# Patient Record
Sex: Female | Born: 1995 | Race: White | Hispanic: No | Marital: Single | Smoking: Current every day smoker
Health system: Southern US, Community
[De-identification: ages and names within clinical notes are randomized; demographics above are authoritative.]

---
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
## 2020-12-11 ENCOUNTER — Other Ambulatory Visit: Payer: Self-pay

## 2020-12-11 ENCOUNTER — Encounter (HOSPITAL_COMMUNITY): Payer: Self-pay

## 2020-12-11 ENCOUNTER — Ambulatory Visit (HOSPITAL_COMMUNITY)
Admission: RE | Admit: 2020-12-11 | Discharge: 2020-12-11 | Disposition: A | Discharge status: Home / Self Care | Payer: Self-pay | Source: Ambulatory Visit | Attending: Urgent Care | Admitting: Urgent Care

## 2020-12-11 ENCOUNTER — Ambulatory Visit (INDEPENDENT_AMBULATORY_CARE_PROVIDER_SITE_OTHER): Payer: Self-pay

## 2020-12-11 VITALS — BP 138/87 | HR 97 | Temp 98.6°F | Resp 19

## 2020-12-11 DIAGNOSIS — R200 Anesthesia of skin: Secondary | ICD-10-CM

## 2020-12-11 DIAGNOSIS — Z833 Family history of diabetes mellitus: Secondary | ICD-10-CM

## 2020-12-11 DIAGNOSIS — R202 Paresthesia of skin: Secondary | ICD-10-CM

## 2020-12-11 DIAGNOSIS — M79601 Pain in right arm: Secondary | ICD-10-CM

## 2020-12-11 LAB — CBG MONITORING, ED: Glucose-Capillary: 89 mg/dL (ref 70–99)

## 2020-12-11 MED ORDER — NAPROXEN 500 MG PO TABS: 500.0000 mg | ORAL_TABLET | Freq: Two times a day (BID) | ORAL | 0 refills | Status: DC

## 2020-12-11 NOTE — ED Triage Notes (Signed)
Pt in with c/o bilateral hand numbness and tingling that has been going on for about 1 week now  Pt states that pain is worse at night and today she feels it constantly in her left hand and forearm

## 2020-12-11 NOTE — ED Provider Notes (Signed)
[facility]   MRN: [id number] DOB: 1996-10-02  Subjective:   Kimberly Krause is a 25 y.o. female presenting for 1 year history of gradually worsening bilateral hand tingling and numbness.  Symptoms have worsened significantly in the past week.  Now has symptoms over her forearms as well with pain, worse at night.  She has been dropping items at work due to her symptoms.  Denies any neck pain, rashes, swelling.  Family history is positive for diabetes with her father.  Outside of her current symptoms, she reports that she is healthy.  Denies taking chronic medications.  No current facility-administered medications for this encounter. No current outpatient medications on file.   Allergies  Allergen Reactions  . Sulfa Antibiotics Rash    History reviewed. No pertinent past medical history.   History reviewed. No pertinent surgical history.  Family History  Problem Relation Age of Onset  . Hypertension Mother   . Diabetes Father   . Hypertension Father     Social History   Tobacco Use  . Smoking status: Current Every Day Smoker  . Smokeless tobacco: Never Used  Substance Use Topics  . Drug use: Never    ROS   Objective:   Vitals: BP 138/87   Pulse 97   Temp 98.6 F (37 C)   Resp 19   LMP 11/02/2020 (Approximate)   SpO2 97%   Physical Exam Constitutional:      General: She is not in acute distress.    Appearance: Normal appearance. She is well-developed. She is not ill-appearing, toxic-appearing or diaphoretic.  HENT:     Head: Normocephalic and atraumatic.     Nose: Nose normal.     Mouth/Throat:     Mouth: Mucous membranes are moist.     Pharynx: Oropharynx is clear.  Eyes:     General: No scleral icterus.       Right eye: No discharge.        Left eye: No discharge.     Extraocular Movements: Extraocular movements intact.     Conjunctiva/sclera: Conjunctivae normal.     Pupils: Pupils are equal, round, and reactive to light.   Cardiovascular:     Rate and Rhythm: Normal rate.  Pulmonary:     Effort: Pulmonary effort is normal.  Musculoskeletal:     Right elbow: No swelling, deformity, effusion or lacerations. Normal range of motion. No tenderness.     Left elbow: No swelling, deformity, effusion or lacerations. Normal range of motion. No tenderness.     Right forearm: Tenderness present. No swelling, edema, deformity, lacerations or bony tenderness.     Left forearm: Tenderness present. No swelling, edema, deformity, lacerations or bony tenderness.     Right wrist: Tenderness present. No swelling, deformity, effusion, lacerations, bony tenderness, snuff box tenderness or crepitus. Normal range of motion.     Left wrist: Tenderness present. No swelling, deformity, effusion, lacerations, bony tenderness, snuff box tenderness or crepitus. Normal range of motion.     Right hand: No swelling, deformity, lacerations, tenderness or bony tenderness. Normal range of motion. Normal strength. Normal sensation. Normal capillary refill.     Left hand: No swelling, deformity, lacerations, tenderness or bony tenderness. Normal range of motion. Normal strength. Normal sensation. Normal capillary refill.     Cervical back: No swelling, edema, deformity, erythema, signs of trauma, lacerations, rigidity, spasms, torticollis, tenderness, bony tenderness or crepitus. No pain with movement. Normal range of motion.     Comments: Strength  5/5 for upper extremities bilaterally.  Endorses tenderness along the entirety of her hands, also of the medial/ulnar aspect of her forearms including the ventral surface.  Positive Tinel's of both forearms bilaterally.  Skin:    General: Skin is warm and dry.  Neurological:     General: No focal deficit present.     Mental Status: She is alert and oriented to person, place, and time.     Motor: No weakness.     Coordination: Coordination normal.     Gait: Gait normal.     Deep Tendon Reflexes: Reflexes  normal.  Psychiatric:        Mood and Affect: Mood normal.        Behavior: Behavior normal.        Thought Content: Thought content normal.        Judgment: Judgment normal.     Results for orders placed or performed during the hospital encounter of 12/11/20 (from the past 24 hour(s))  POC CBG monitoring     Status: None   Collection Time: 12/11/20  5:36 PM  Result Value Ref Range   Glucose-Capillary 89 70 - 99 mg/dL   DG Cervical Spine Complete  Result Date: 12/11/2020 CLINICAL DATA:  One week of bilateral hand numbness and tingling EXAM: CERVICAL SPINE - COMPLETE 4+ VIEW COMPARISON:  None. FINDINGS: 7 cervical type vertebral bodies are visualized on the lateral view there is no evidence of cervical spine fracture or prevertebral soft tissue swelling. Alignment is normal. No other significant bone abnormalities are identified. IMPRESSION: Negative cervical spine radiographs. Electronically Signed   By: Maudry Mayhew MD   On: 12/11/2020 17:48   Assessment and Plan :   PDMP not reviewed this encounter.  1. Numbness and tingling in both hands   2. Bilateral arm pain   3. Family history of diabetes mellitus   4. Bilateral arm numbness and tingling while sleeping     Negative diabetes, cervical x-rays. Unclear etiology for her neuropathic type pain.  Suspect that this is related to the strenuous and repetitive nature of her work.  Recommended rest, naproxen for pain and inflammation.  Follow-up with Ortho soon as possible for further work-up. Counseled patient on potential for adverse effects with medications prescribed/recommended today, ER and return-to-clinic precautions discussed, patient verbalized understanding.    Wallis Bamberg, [place] 12/11/20 [id number]

## 2020-12-16 ENCOUNTER — Other Ambulatory Visit: Payer: Self-pay

## 2020-12-16 ENCOUNTER — Ambulatory Visit (HOSPITAL_COMMUNITY)
Admission: EM | Admit: 2020-12-16 | Discharge: 2020-12-16 | Disposition: A | Discharge status: Home / Self Care | Payer: Self-pay | Attending: Urgent Care | Admitting: Urgent Care

## 2020-12-16 ENCOUNTER — Encounter (HOSPITAL_COMMUNITY): Payer: Self-pay

## 2020-12-16 DIAGNOSIS — R059 Cough, unspecified: Secondary | ICD-10-CM

## 2020-12-16 DIAGNOSIS — R0981 Nasal congestion: Secondary | ICD-10-CM

## 2020-12-16 DIAGNOSIS — R000 Tachycardia, unspecified: Secondary | ICD-10-CM

## 2020-12-16 DIAGNOSIS — J029 Acute pharyngitis, unspecified: Secondary | ICD-10-CM

## 2020-12-16 DIAGNOSIS — J020 Streptococcal pharyngitis: Secondary | ICD-10-CM

## 2020-12-16 LAB — POCT RAPID STREP A, ED / UC: Streptococcus, Group A Screen (Direct): POSITIVE — AB

## 2020-12-16 MED ORDER — AMOXICILLIN 500 MG PO CAPS: 500.0000 mg | ORAL_CAPSULE | Freq: Two times a day (BID) | ORAL | 0 refills | Status: DC

## 2020-12-16 NOTE — ED Triage Notes (Signed)
Pt presents with sore throat X 2 days. 

## 2020-12-16 NOTE — ED Provider Notes (Addendum)
[facility]   MRN: [id number] DOB: 1996-06-25  Subjective:   Kimberly Krause is a 25 y.o. female presenting for 2-day history of acute onset nasal congestion, throat pain, painful swallowing, decreased appetite and a mild cough.  Patient did have COVID-19 at the end of 2021.  She is a smoker.  Denies chest pain, shortness of breath, fever, body aches.  No current facility-administered medications for this encounter.  Current Outpatient Medications:  .  naproxen (NAPROSYN) 500 MG tablet, Take 1 tablet (500 mg total) by mouth 2 (two) times daily with a meal., Disp: 30 tablet, Rfl: 0   Allergies  Allergen Reactions  . Sulfa Antibiotics Rash    History reviewed. No pertinent past medical history.   History reviewed. No pertinent surgical history.  Family History  Problem Relation Age of Onset  . Hypertension Mother   . Diabetes Father   . Hypertension Father     Social History   Tobacco Use  . Smoking status: Current Every Day Smoker  . Smokeless tobacco: Never Used  Substance Use Topics  . Drug use: Never    ROS   Objective:   Vitals: BP (!) 141/90 (BP Location: Left Arm)   Pulse (!) 108   Temp 98.8 F (37.1 C) (Oral)   Resp 18   LMP 12/16/2020   SpO2 96%   Physical Exam Constitutional:      General: She is not in acute distress.    Appearance: Normal appearance. She is well-developed. She is not ill-appearing, toxic-appearing or diaphoretic.  HENT:     Head: Normocephalic and atraumatic.     Nose: Nose normal.     Mouth/Throat:     Mouth: Mucous membranes are moist.     Pharynx: Posterior oropharyngeal erythema present. No pharyngeal swelling, oropharyngeal exudate or uvula swelling.     Tonsils: No tonsillar exudate or tonsillar abscesses. 1+ on the right. 1+ on the left.  Eyes:     Extraocular Movements: Extraocular movements intact.     Pupils: Pupils are equal, round, and reactive to light.  Cardiovascular:     Rate and Rhythm:  Normal rate and regular rhythm.     Pulses: Normal pulses.     Heart sounds: Normal heart sounds. No murmur heard. No friction rub. No gallop.   Pulmonary:     Effort: Pulmonary effort is normal. No respiratory distress.     Breath sounds: Normal breath sounds. No stridor. No wheezing, rhonchi or rales.  Skin:    General: Skin is warm and dry.     Findings: No rash.  Neurological:     Mental Status: She is alert and oriented to person, place, and time.  Psychiatric:        Mood and Affect: Mood normal.        Behavior: Behavior normal.        Thought Content: Thought content normal.     Results for orders placed or performed during the hospital encounter of 12/16/20 (from the past 24 hour(s))  POCT Rapid Strep A     Status: Abnormal   Collection Time: 12/16/20  9:11 AM  Result Value Ref Range   Streptococcus, Group A Screen (Direct) POSITIVE (A) NEGATIVE    Assessment and Plan :   PDMP not reviewed this encounter.  1. Strep throat   2. Sore throat   3. Nasal congestion   4. Cough   5. Tachycardia     Will treat for strep pharyngitis.  Patient is to start amoxicillin, use supportive care otherwise. Suspect tachycardia is sign of systemic illness. Will monitor. Counseled patient on potential for adverse effects with medications prescribed/recommended today, ER and return-to-clinic precautions discussed, patient verbalized understanding.   Wallis Bamberg, [place] 12/16/20 [phone]

## 2020-12-17 ENCOUNTER — Ambulatory Visit (INDEPENDENT_AMBULATORY_CARE_PROVIDER_SITE_OTHER): Payer: Self-pay | Admitting: Orthopaedic Surgery

## 2020-12-17 ENCOUNTER — Encounter: Payer: Self-pay | Admitting: Orthopaedic Surgery

## 2020-12-17 DIAGNOSIS — R200 Anesthesia of skin: Secondary | ICD-10-CM

## 2020-12-17 NOTE — Addendum Note (Signed)
Addended by: Fredda Hammed L on: 12/17/2020 11:13 AM   Modules accepted: Orders

## 2020-12-17 NOTE — Progress Notes (Signed)
Office Visit Note   Patient: Kimberly Krause           Date of Birth: [birth year]           MRN: [id number] Visit Date: 12/17/2020              Requested by: Wallis Bamberg, PA-C [address] [place],  [place] [address] PCP: Patient, No Pcp Per   Assessment & Plan: Visit Diagnoses:  1. Bilateral hand numbness     Plan: We will obtain EMG nerve conduction studies of both upper extremities rule out carpal tunnel syndrome as source of her numbness tingling.  Her distribution of numbness is both in the ulnar and median nerve distribution of both hands and I discussed with her that the studies could help differentiate what is going on with both hands.  We will see her back about a week after the studies to go over the results and discuss further treatment.  Questions were encouraged and answered.  Follow-Up Instructions: Return Follow up after EMG NCS.   Orders:  No orders of the defined types were placed in this encounter.  No orders of the defined types were placed in this encounter.     Procedures: No procedures performed   Clinical Data: No additional findings.   Subjective: Chief Complaint  Patient presents with  . Left Hand - Pain  . Right Hand - Pain    HPI Patient is a 25 year old female seen for the first time for numbness tingling in both hands been ongoing for the last year.  She states her left hand is worse than the right.  Numbness tingling does awaken her.  She has to shake her hands.  She has numbness tingling in both hands during the day.  She states that she has difficulty opening objects due to the numbness tingling in her hands and drops things occasionally.  She is tried naproxen without any real relief.  She is seen in the ER yesterday and tested positive for strep throat.  Review of Systems Please see HPI otherwise negative  Objective: Vital Signs: LMP 12/16/2020   Physical Exam Constitutional:      Appearance: She is not ill-appearing or  diaphoretic.  Pulmonary:     Effort: Pulmonary effort is normal.  Neurological:     Mental Status: She is alert and oriented to person, place, and time.  Psychiatric:        Mood and Affect: Mood normal.     Ortho Exam Bilateral hands she has full motor.  Subjective decreased sensation left hand second through fifth fingers to light touch.  Full sensation right hand throughout the light touch.  Phalen's test is positive on the left negative on the right.  Compression test over the median nerve at the left wrist causes numbness tingling throughout the whole hand.  Tinel's over the wrist bilaterally is negative.  Negative compression test over the right median nerve at the wrist.  Full range of motion cervical spine without pain. Specialty Comments:  No specialty comments available.  Imaging: No results found.   PMFS History: There are no problems to display for this patient.  No past medical history on file.  Family History  Problem Relation Age of Onset  . Hypertension Mother   . Diabetes Father   . Hypertension Father     No past surgical history on file. Social History   Occupational History  . Not on file  Tobacco Use  . Smoking status:  Current Every Day Smoker  . Smokeless tobacco: Never Used  Substance and Sexual Activity  . Alcohol use: Not on file  . Drug use: Never  . Sexual activity: Not on file

## 2020-12-19 ENCOUNTER — Telehealth: Payer: Self-pay | Admitting: Physical Medicine and Rehabilitation

## 2020-12-19 NOTE — Telephone Encounter (Signed)
Patient called returning a call to [place]. Please call patient at [phone].

## 2021-01-22 ENCOUNTER — Telehealth: Payer: Self-pay | Admitting: Physical Medicine and Rehabilitation

## 2021-01-22 ENCOUNTER — Encounter: Payer: Medicaid Other | Admitting: Physical Medicine and Rehabilitation

## 2021-01-22 NOTE — Telephone Encounter (Signed)
Rescheduled

## 2021-01-22 NOTE — Telephone Encounter (Signed)
Patient called requesting a call back to reschedule an appt. Please call patient at [phone].

## 2021-01-31 DIAGNOSIS — Z419 Encounter for procedure for purposes other than remedying health state, unspecified: Secondary | ICD-10-CM | POA: Diagnosis not present

## 2021-02-25 ENCOUNTER — Encounter: Payer: Self-pay | Admitting: Physical Medicine and Rehabilitation

## 2021-02-25 ENCOUNTER — Other Ambulatory Visit: Payer: Self-pay

## 2021-02-25 ENCOUNTER — Ambulatory Visit (INDEPENDENT_AMBULATORY_CARE_PROVIDER_SITE_OTHER): Payer: Medicaid Other | Admitting: Physical Medicine and Rehabilitation

## 2021-02-25 DIAGNOSIS — R202 Paresthesia of skin: Secondary | ICD-10-CM

## 2021-02-25 NOTE — Progress Notes (Signed)
Pt state she has weakness, numbness and tingling in both hands. Pt state that it the pain wakes her up out of her sleep. 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 Pt state she right handed.  Numeric Pain Rating Scale and Functional Assessment Average Pain 5   In the last MONTH (on 0-10 scale) has pain interfered with the following?  1. General activity like being  able to carry out your everyday physical activities such as walking, climbing stairs, carrying groceries, or moving a chair?  Rating(8)

## 2021-02-26 NOTE — Progress Notes (Signed)
[facility]use - 25 y.o. female MRN [id number]  Date of birth: [birth year]  Office Visit Note: Visit Date: 02/25/2021 PCP: Patient, No Pcp Per (Inactive) Referred by: Kirtland Bouchard, PA-C  Subjective: Chief Complaint  Patient presents with  . Right Hand - Pain, Weakness, Numbness, Tingling  . Left Hand - Pain, Weakness, Numbness, Tingling   HPI:  Kimberly Krause is a 25 y.o. female who comes in today at the request of Rexene Edison, PA-C for electrodiagnostic study of the Bilateral upper extremities.  Patient is Right hand dominant.  She reports 5 out of 10 average pain with numbness and tingling into both hands somewhat more radial digits but can be global.  She reports that the pain wakes her up at night and she does get nocturnal symptoms.  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  She does feel like both hands are pretty equal.  She has no prior electrodiagnostic studies.  She is not diabetic.  There is no frank radicular symptoms.   ROS Otherwise per HPI.  Assessment & Plan: Visit Diagnoses:    ICD-10-CM   1. 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   There is no significant electrodiagnostic evidence of any other focal nerve entrapment, brachial plexopathy or cervical radiculopathy in either upper limb.   Recommendations: 1.  Follow-up with referring physician. 2.  Continue current management of symptoms. 3.  Continue use of resting splint at night-time and as needed during the day. 4.  Suggest surgical evaluation.  Meds & Orders: No orders of the defined types were placed in this encounter.   Orders Placed This Encounter  Procedures  . NCV with EMG (electromyography)    Follow-up: Return in about 2 weeks (around 03/11/2021)  for Rexene Edison, P.A.-C.   Procedures: No procedures performed  EMG & NCV Findings: Evaluation of the left median motor nerve showed reduced amplitude (1.4 mV) and decreased conduction velocity (Elbow-Wrist, 48 m/s).  The left median (across palm) sensory nerve showed prolonged distal peak latency (Wrist, 3.7 ms).  All remaining nerves (as indicated in the following tables) were within normal limits.  Left vs. Right side comparison data for the median motor nerve indicates abnormal L-R latency difference (0.8 ms), abnormal L-R amplitude difference (80.8 %), and abnormal L-R velocity difference (Elbow-Wrist, 10 m/s).    All examined muscles (as indicated in the following table) showed no evidence of electrical instability.    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   There is no significant electrodiagnostic evidence of any other focal nerve entrapment, brachial plexopathy or cervical radiculopathy in either upper limb.   Recommendations: 1.  Follow-up with referring physician. 2.  Continue current management of symptoms. 3.  Continue use of resting splint at night-time and as needed during the day. 4.  Suggest surgical evaluation.  ___________________________ Naaman Plummer FAAPMR Board Certified, American Board of Physical Medicine and Rehabilitation    Nerve Conduction Studies Anti Sensory Summary Table   Stim Site NR Peak (ms) Norm Peak (ms) P-T Amp (V) Norm P-T Amp Site1 Site2 Delta-P (ms) Dist (cm) Vel (m/s) Norm Vel (m/s)  Left Median Acr Maurilio Lovely  Anti Sensory (2nd Digit)  31.2C  Wrist    *3.7 <3.6 10.0 >10 Wrist Palm 1.8 0.0    Palm    1.9 <2.0 12.2         Right Median Acr Palm Anti Sensory (2nd Digit)  31.7C  Wrist    3.3 <3.6 29.0 >10 Wrist Palm 1.5 0.0    Palm    1.8 <2.0 23.0         Right Radial Anti Sensory (Base  1st Digit)  31.6C  Wrist    2.1 <3.1 22.9  Wrist Base 1st Digit 2.1 0.0    Right Ulnar Anti Sensory (5th Digit)  32C  Wrist    2.8 <3.7 32.7 >15.0 Wrist 5th Digit 2.8 14.0 50 >38   Motor Summary Table   Stim Site NR Onset (ms) Norm Onset (ms) O-P Amp (mV) Norm O-P Amp Site1 Site2 Delta-0 (ms) Dist (cm) Vel (m/s) Norm Vel (m/s)  Left Median Motor (Abd Poll Brev)  31.5C  Wrist    4.2 <4.2 *1.4 >5 Elbow Wrist 4.0 19.0 *48 >50  Elbow    8.2  5.9  Axilla Elbow 3.9 0.0    Axilla    4.3  1.6         Right Median Motor (Abd Poll Brev)  31.7C  Wrist    3.4 <4.2 7.3 >5 Elbow Wrist 3.6 21.0 58 >50  Elbow    7.0  8.6         Right Ulnar Motor (Abd Dig Min)  31.7C  Wrist    2.6 <4.2 11.9 >3 B Elbow Wrist 2.9 19.5 67 >53  B Elbow    5.5  7.8  A Elbow B Elbow 1.0 10.0 100 >53  A Elbow    6.5  7.7          EMG   Side Muscle Nerve Root Ins Act Fibs Psw Amp Dur Poly Recrt Int Dennie Bible Comment  Left Abd Poll Brev Median C8-T1 Nml Nml Nml Nml Nml 0 Nml Nml   Left 1stDorInt Ulnar C8-T1 Nml Nml Nml Nml Nml 0 Nml Nml   Left PronatorTeres Median C6-7 Nml Nml Nml Nml Nml 0 Nml Nml   Left Biceps Musculocut C5-6 Nml Nml Nml Nml Nml 0 Nml Nml   Left Deltoid Axillary C5-6 Nml Nml Nml Nml Nml 0 Nml Nml     Nerve Conduction Studies Anti Sensory Left/Right Comparison   Stim Site L Lat (ms) R Lat (ms) L-R Lat (ms) L Amp (V) R Amp (V) L-R Amp (%) Site1 Site2 L Vel (m/s) R Vel (m/s) L-R Vel (m/s)  Median Acr Palm Anti Sensory (2nd Digit)  31.2C  Wrist *3.7 3.3 0.4 10.0 29.0 65.5 Wrist Palm     Palm 1.9 1.8 0.1 12.2 23.0 47.0       Radial Anti Sensory (Base 1st Digit)  31.6C  Wrist  2.1   22.9  Wrist Base 1st Digit     Ulnar Anti Sensory (5th Digit)  32C  Wrist  2.8   32.7  Wrist 5th Digit  50    Motor Left/Right Comparison   Stim Site L Lat (ms) R Lat (ms) L-R Lat (ms) L Amp (mV) R Amp (mV) L-R Amp (%) Site1 Site2 L Vel (m/s) R Vel (m/s) L-R Vel (m/s)  Median Motor (Abd Poll Brev)  31.5C  Wrist 4.2  3.4 *0.8 *1.4 7.3 *80.8 Elbow Wrist *48 58 *10  Elbow 8.2 7.0 1.2 5.9 8.6 31.4 Axilla  Elbow     Axilla 4.3   1.6         Ulnar Motor (Abd Dig Min)  31.7C  Wrist  2.6   11.9  B Elbow Wrist  67   B Elbow  5.5   7.8  A Elbow B Elbow  100   A Elbow  6.5   7.7           Waveforms:                 Clinical History: No specialty comments available.     Objective:  VS:  HT:    WT:   BMI:     BP:   HR: bpm  TEMP: ( )  RESP:  Physical Exam Musculoskeletal:        General: No swelling, tenderness or deformity.     Comments: Inspection reveals no atrophy of the bilateral APB or FDI or hand intrinsics. There is no swelling, color changes, allodynia or dystrophic changes. There is 5 out of 5 strength in the bilateral wrist extension, finger abduction and long finger flexion. There is intact sensation to light touch in all dermatomal and peripheral nerve distributions.  There is a positive Phalen's test bilaterally. There is a negative Hoffmann's test bilaterally.  Skin:    General: Skin is warm and dry.     Findings: No erythema or rash.  Neurological:     General: No focal deficit present.     Mental Status: She is alert and oriented to person, place, and time.     Motor: No weakness or abnormal muscle tone.     Coordination: Coordination normal.  Psychiatric:        Mood and Affect: Mood normal.        Behavior: Behavior normal.      Imaging: No results found.

## 2021-02-26 NOTE — Procedures (Signed)
EMG & NCV Findings: Evaluation of the left median motor nerve showed reduced amplitude (1.4 mV) and decreased conduction velocity (Elbow-Wrist, 48 m/s).  The left median (across palm) sensory nerve showed prolonged distal peak latency (Wrist, 3.7 ms).  All remaining nerves (as indicated in the following tables) were within normal limits.  Left vs. Right side comparison data for the median motor nerve indicates abnormal L-R latency difference (0.8 ms), abnormal L-R amplitude difference (80.8 %), and abnormal L-R velocity difference (Elbow-Wrist, 10 m/s).    All examined muscles (as indicated in the following table) showed no evidence of electrical instability.    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   There is no significant electrodiagnostic evidence of any other focal nerve entrapment, brachial plexopathy or cervical radiculopathy in either upper limb.   Recommendations: 1.  Follow-up with referring physician. 2.  Continue current management of symptoms. 3.  Continue use of resting splint at night-time and as needed during the day. 4.  Suggest surgical evaluation.  ___________________________ Naaman Plummer FAAPMR Board Certified, American Board of Physical Medicine and Rehabilitation    Nerve Conduction Studies Anti Sensory Summary Table   Stim Site NR Peak (ms) Norm Peak (ms) P-T Amp (V) Norm P-T Amp Site1 Site2 Delta-P (ms) Dist (cm) Vel (m/s) Norm Vel (m/s)  Left Median Acr Palm Anti Sensory (2nd Digit)  31.2C  Wrist    *3.7 <3.6 10.0 >10 Wrist Palm 1.8 0.0    Palm    1.9 <2.0 12.2         Right Median Acr Palm Anti Sensory (2nd Digit)  31.7C  Wrist    3.3 <3.6 29.0 >10 Wrist Palm 1.5 0.0    Palm    1.8 <2.0 23.0         Right Radial Anti Sensory (Base 1st Digit)  31.6C  Wrist    2.1 <3.1 22.9  Wrist Base 1st Digit  2.1 0.0    Right Ulnar Anti Sensory (5th Digit)  32C  Wrist    2.8 <3.7 32.7 >15.0 Wrist 5th Digit 2.8 14.0 50 >38   Motor Summary Table   Stim Site NR Onset (ms) Norm Onset (ms) O-P Amp (mV) Norm O-P Amp Site1 Site2 Delta-0 (ms) Dist (cm) Vel (m/s) Norm Vel (m/s)  Left Median Motor (Abd Poll Brev)  31.5C  Wrist    4.2 <4.2 *1.4 >5 Elbow Wrist 4.0 19.0 *48 >50  Elbow    8.2  5.9  Axilla Elbow 3.9 0.0    Axilla    4.3  1.6         Right Median Motor (Abd Poll Brev)  31.7C  Wrist    3.4 <4.2 7.3 >5 Elbow Wrist 3.6 21.0 58 >50  Elbow    7.0  8.6         Right Ulnar Motor (Abd Dig Min)  31.7C  Wrist    2.6 <4.2 11.9 >3 B Elbow Wrist 2.9 19.5 67 >53  B Elbow    5.5  7.8  A Elbow B Elbow 1.0 10.0 100 >53  A Elbow    6.5  7.7          EMG   Side Muscle Nerve Root Ins Act Fibs Psw Amp Dur Poly Recrt Int Dennie Bible Comment  Left Abd Poll Brev Median C8-T1 Nml Nml Nml Nml Nml 0 Nml Nml  Left 1stDorInt Ulnar C8-T1 Nml Nml Nml Nml Nml 0 Nml Nml   Left PronatorTeres Median C6-7 Nml Nml Nml Nml Nml 0 Nml Nml   Left Biceps Musculocut C5-6 Nml Nml Nml Nml Nml 0 Nml Nml   Left Deltoid Axillary C5-6 Nml Nml Nml Nml Nml 0 Nml Nml     Nerve Conduction Studies Anti Sensory Left/Right Comparison   Stim Site L Lat (ms) R Lat (ms) L-R Lat (ms) L Amp (V) R Amp (V) L-R Amp (%) Site1 Site2 L Vel (m/s) R Vel (m/s) L-R Vel (m/s)  Median Acr Palm Anti Sensory (2nd Digit)  31.2C  Wrist *3.7 3.3 0.4 10.0 29.0 65.5 Wrist Palm     Palm 1.9 1.8 0.1 12.2 23.0 47.0       Radial Anti Sensory (Base 1st Digit)  31.6C  Wrist  2.1   22.9  Wrist Base 1st Digit     Ulnar Anti Sensory (5th Digit)  32C  Wrist  2.8   32.7  Wrist 5th Digit  50    Motor Left/Right Comparison   Stim Site L Lat (ms) R Lat (ms) L-R Lat (ms) L Amp (mV) R Amp (mV) L-R Amp (%) Site1 Site2 L Vel (m/s) R Vel (m/s) L-R Vel (m/s)  Median Motor (Abd Poll Brev)  31.5C  Wrist 4.2 3.4 *0.8 *1.4 7.3 *80.8 Elbow Wrist *48 58 *10  Elbow 8.2 7.0 1.2  5.9 8.6 31.4 Axilla Elbow     Axilla 4.3   1.6         Ulnar Motor (Abd Dig Min)  31.7C  Wrist  2.6   11.9  B Elbow Wrist  67   B Elbow  5.5   7.8  A Elbow B Elbow  100   A Elbow  6.5   7.7           Waveforms:

## 2021-03-02 DIAGNOSIS — Z419 Encounter for procedure for purposes other than remedying health state, unspecified: Secondary | ICD-10-CM | POA: Diagnosis not present

## 2021-03-03 ENCOUNTER — Ambulatory Visit: Payer: Medicaid Other | Admitting: Physician Assistant

## 2021-04-02 DIAGNOSIS — Z419 Encounter for procedure for purposes other than remedying health state, unspecified: Secondary | ICD-10-CM | POA: Diagnosis not present

## 2021-04-18 DIAGNOSIS — F17210 Nicotine dependence, cigarettes, uncomplicated: Secondary | ICD-10-CM | POA: Diagnosis not present

## 2021-04-18 DIAGNOSIS — R070 Pain in throat: Secondary | ICD-10-CM | POA: Diagnosis not present

## 2021-04-18 DIAGNOSIS — J029 Acute pharyngitis, unspecified: Secondary | ICD-10-CM | POA: Diagnosis not present

## 2021-04-18 DIAGNOSIS — Z20822 Contact with and (suspected) exposure to covid-19: Secondary | ICD-10-CM | POA: Diagnosis not present

## 2021-04-18 DIAGNOSIS — R059 Cough, unspecified: Secondary | ICD-10-CM | POA: Diagnosis not present

## 2021-04-22 DIAGNOSIS — R059 Cough, unspecified: Secondary | ICD-10-CM | POA: Diagnosis not present

## 2021-04-22 DIAGNOSIS — J029 Acute pharyngitis, unspecified: Secondary | ICD-10-CM | POA: Diagnosis not present

## 2021-04-25 ENCOUNTER — Other Ambulatory Visit: Payer: Self-pay

## 2021-04-25 ENCOUNTER — Emergency Department (HOSPITAL_BASED_OUTPATIENT_CLINIC_OR_DEPARTMENT_OTHER): Payer: Medicaid Other | Admitting: Radiology

## 2021-04-25 ENCOUNTER — Encounter (HOSPITAL_BASED_OUTPATIENT_CLINIC_OR_DEPARTMENT_OTHER): Payer: Self-pay

## 2021-04-25 ENCOUNTER — Emergency Department (HOSPITAL_BASED_OUTPATIENT_CLINIC_OR_DEPARTMENT_OTHER)
Admission: EM | Admit: 2021-04-25 | Discharge: 2021-04-26 | Disposition: A | Discharge status: Home / Self Care | Payer: Medicaid Other | Attending: Emergency Medicine | Admitting: Emergency Medicine

## 2021-04-25 DIAGNOSIS — F17210 Nicotine dependence, cigarettes, uncomplicated: Secondary | ICD-10-CM | POA: Insufficient documentation

## 2021-04-25 DIAGNOSIS — J209 Acute bronchitis, unspecified: Secondary | ICD-10-CM | POA: Diagnosis not present

## 2021-04-25 DIAGNOSIS — R519 Headache, unspecified: Secondary | ICD-10-CM | POA: Diagnosis not present

## 2021-04-25 DIAGNOSIS — R059 Cough, unspecified: Secondary | ICD-10-CM | POA: Diagnosis not present

## 2021-04-25 LAB — COMPREHENSIVE METABOLIC PANEL
ALT: 13 U/L (ref 0–44)
AST: 12 U/L — ABNORMAL LOW (ref 15–41)
Albumin: 3.8 g/dL (ref 3.5–5.0)
Alkaline Phosphatase: 64 U/L (ref 38–126)
Anion gap: 9 (ref 5–15)
BUN: 10 mg/dL (ref 6–20)
CO2: 26 mmol/L (ref 22–32)
Calcium: 8.8 mg/dL — ABNORMAL LOW (ref 8.9–10.3)
Chloride: 104 mmol/L (ref 98–111)
Creatinine, Ser: 0.78 mg/dL (ref 0.44–1.00)
GFR, Estimated: >60 mL/min (ref >60)
Glucose, Bld: 92 mg/dL (ref 70–99)
Potassium: 4.1 mmol/L (ref 3.5–5.1)
Sodium: 139 mmol/L (ref 135–145)
Total Bilirubin: 0.3 mg/dL (ref 0.3–1.2)
Total Protein: 6.5 g/dL (ref 6.5–8.1)

## 2021-04-25 LAB — CBC WITH DIFFERENTIAL/PLATELET
Abs Immature Granulocytes: 0.03 10*3/uL (ref 0.00–0.07)
Basophils Absolute: 0.1 10*3/uL (ref 0.0–0.1)
Basophils Relative: 1 %
Eosinophils Absolute: 0.1 10*3/uL (ref 0.0–0.5)
Eosinophils Relative: 1 %
HCT: 39.5 % (ref 36.0–46.0)
Hemoglobin: 13 g/dL (ref 12.0–15.0)
Immature Granulocytes: 0 %
Lymphocytes Relative: 28 %
Lymphs Abs: 3.2 10*3/uL (ref 0.7–4.0)
MCH: 27.9 pg (ref 26.0–34.0)
MCHC: 32.9 g/dL (ref 30.0–36.0)
MCV: 84.8 fL (ref 80.0–100.0)
Monocytes Absolute: 0.7 10*3/uL (ref 0.1–1.0)
Monocytes Relative: 6 %
Neutro Abs: 7.6 10*3/uL (ref 1.7–7.7)
Neutrophils Relative %: 64 %
Platelets: 277 10*3/uL (ref 150–400)
RBC: 4.66 MIL/uL (ref 3.87–5.11)
RDW: 14 % (ref 11.5–15.5)
WBC: 11.7 10*3/uL — ABNORMAL HIGH (ref 4.0–10.5)
nRBC: 0 % (ref 0.0–0.2)

## 2021-04-25 LAB — MONONUCLEOSIS SCREEN: Mono Screen: NEGATIVE

## 2021-04-25 MED ORDER — SODIUM CHLORIDE 0.9 % IV BOLUS
1000.0000 mL | Freq: Once | INTRAVENOUS | Status: AC
  Administered 2021-04-25: 1000 mL via INTRAVENOUS

## 2021-04-25 MED ORDER — PROCHLORPERAZINE EDISYLATE 10 MG/2ML IJ SOLN
10.0000 mg | Freq: Once | INTRAMUSCULAR | Status: AC
  Administered 2021-04-25: 10 mg via INTRAVENOUS
  Filled 2021-04-25: qty 2

## 2021-04-25 MED ORDER — IPRATROPIUM-ALBUTEROL 0.5-2.5 (3) MG/3ML IN SOLN
3.0000 mL | Freq: Once | RESPIRATORY_TRACT | Status: AC
  Administered 2021-04-26: 3 mL via RESPIRATORY_TRACT
  Filled 2021-04-25: qty 3

## 2021-04-25 MED ORDER — DIPHENHYDRAMINE HCL 50 MG/ML IJ SOLN
25.0000 mg | Freq: Once | INTRAMUSCULAR | Status: AC
  Administered 2021-04-25: 25 mg via INTRAVENOUS
  Filled 2021-04-25: qty 1

## 2021-04-25 NOTE — ED Triage Notes (Signed)
Patient here POV from Home with Cough, Sore Throat, Fatigue, and Headache.  Cough and Sore Throat began approximately 2 weeks PTA. Patient was prescribed Antibiotics from UC after COVID-19 Test and Strep Test were both negative.   Symptoms have worsened and progressed into Fatigue and Headache 2 days PTA.   Ambulatory, GCS 15. Mild Nausea.

## 2021-04-25 NOTE — ED Provider Notes (Signed)
[facility] EMERGENCY DEPT Provider Note   CSN: [id number] Arrival date & time: 04/25/21  2052     History Chief Complaint  Patient presents with   Sore Throat   Cough   Headache   Fatigue    Kimberly Krause is a 25 y.o. female.  The history is provided by the patient.  Sore Throat Associated symptoms include headaches.  Cough Associated symptoms: headaches   Headache Associated symptoms: cough   She has been sick for about the last 10 days with sore throat and cough.  Cough is productive of some yellowish sputum.  She denies fever, chills, sweats.  There has been some anorexia but no nausea or vomiting.  She has also had a global headache which has gotten worse over the last 2 days.  Headache is worse when she sits up, better if she is laying down.  She denies arthralgias or myalgias.  She had gone to an urgent care center where strep and COVID tests were negative and she was treated with azithromycin and prednisone with no relief.   History reviewed. No pertinent past medical history.  There are no problems to display for this patient.   History reviewed. No pertinent surgical history.   OB History   No obstetric history on file.     Family History  Problem Relation Age of Onset   Hypertension Mother    Diabetes Father    Hypertension Father     Social History   Tobacco Use   Smoking status: Every Day    Pack years: 0.00    Types: Cigarettes   Smokeless tobacco: Never  Substance Use Topics   Alcohol use: Yes    Comment: Socially   Drug use: Never    Home Medications Prior to Admission medications   Medication Sig Start Date End Date Taking? Authorizing Provider  amoxicillin (AMOXIL) 500 MG capsule Take 1 capsule (500 mg total) by mouth 2 (two) times daily. 12/16/20   Wallis Bamberg, PA-C  naproxen (NAPROSYN) 500 MG tablet Take 1 tablet (500 mg total) by mouth 2 (two) times daily with a meal. 12/11/20   Wallis Bamberg, PA-C    Allergies     Sulfa antibiotics  Review of Systems   Review of Systems  Respiratory:  Positive for cough.   Neurological:  Positive for headaches.  All other systems reviewed and are negative.  Physical Exam Updated Vital Signs BP 131/78 (BP Location: Right Arm)   Pulse 100   Temp 98.7 F (37.1 C) (Oral)   Resp 16   Ht 5\' 5"  (1.651 m)   Wt 127 kg   SpO2 100%   BMI 46.59 kg/m   Physical Exam Vitals and nursing note reviewed.  25 year old female, resting comfortably and in no acute distress. Vital signs are normal. Oxygen saturation is 100%, which is normal. Head is normocephalic and atraumatic. PERRLA, EOMI. Oropharynx is clear. Neck is nontender and supple without adenopathy or JVD. Back is nontender and there is no CVA tenderness. Lungs are clear without rales, wheezes, or rhonchi.  There is a slight prolongation of exhalation phase and slight wheezing is noted with cough. Chest is nontender. Heart has regular rate and rhythm without murmur. Abdomen is soft, flat, nontender without masses or hepatosplenomegaly and peristalsis is normoactive. Extremities have no cyanosis or edema, full range of motion is present. Skin is warm and dry without rash. Neurologic: Mental status is normal, cranial nerves are intact, there are no motor or sensory  deficits.  ED Results / Procedures / Treatments   Labs (all labs ordered are listed, but only abnormal results are displayed) Labs Reviewed  CBC WITH DIFFERENTIAL/PLATELET - Abnormal; Notable for the following components:      Result Value   WBC 11.7 (*)    All other components within normal limits  COMPREHENSIVE METABOLIC PANEL - Abnormal; Notable for the following components:   Calcium 8.8 (*)    AST 12 (*)    All other components within normal limits  MONONUCLEOSIS SCREEN   Radiology DG Chest 2 View  Result Date: 04/25/2021 CLINICAL DATA:  Cough EXAM: CHEST - 2 VIEW COMPARISON:  None. FINDINGS: The heart size and mediastinal contours are  within normal limits. Both lungs are clear. The visualized skeletal structures are unremarkable. IMPRESSION: No active cardiopulmonary disease. Electronically Signed   By: Jasmine Pang M.D.   On: 04/25/2021 23:39    Procedures Procedures   Medications Ordered in ED Medications  sodium chloride 0.9 % bolus 1,000 mL (has no administration in time range)  diphenhydrAMINE (BENADRYL) injection 25 mg (has no administration in time range)  prochlorperazine (COMPAZINE) injection 10 mg (has no administration in time range)  ipratropium-albuterol (DUONEB) 0.5-2.5 (3) MG/3ML nebulizer solution 3 mL (has no administration in time range)    ED Course  I have reviewed the triage vital signs and the nursing notes.  Pertinent labs & imaging results that were available during my care of the patient were reviewed by me and considered in my medical decision making (see chart for details).   MDM Rules/Calculators/A&P                         Sore throat, cough, headache which appears to be part of a viral syndrome.  Pattern is not suggestive of influenza, and she did have a negative COVID test.  Will check monoscreen.  We will also check chest x-ray to rule out pneumonia.  She will be given IV fluids, prochlorperazine, diphenhydramine to try to treat her headache, will also be given a therapeutic trial of albuterol with ipratropium via nebulizer.  Old records were reviewed, and she was treated for strep throat in February of this year.  Chest x-ray shows no evidence of pneumonia.  Labs are unremarkable including negative monoscreen.  Cough is much improved following albuterol with ipratropium.  Headache is completely resolved following headache cocktail.  Patient is discharged with prescription for albuterol inhaler and told to use over-the-counter analgesics as needed for headache.  Encouraged to maintain good fluid intake.  Return precautions discussed.  Final Clinical Impression(s) / ED Diagnoses Final  diagnoses:  Acute bronchitis, unspecified organism  Bad headache    Rx / DC Orders ED Discharge Orders          Ordered    albuterol (VENTOLIN HFA) 108 (90 Base) MCG/ACT inhaler  Every 4 hours PRN        04/26/21 0055             Dione Booze, MD 04/26/21 [phone]

## 2021-04-26 MED ORDER — ALBUTEROL SULFATE HFA 108 (90 BASE) MCG/ACT IN AERS: 2.0000 | INHALATION_SPRAY | RESPIRATORY_TRACT | 0 refills | Status: DC | PRN

## 2021-04-26 NOTE — Discharge Instructions (Addendum)
Drink plenty of fluids.  Take acetaminophen and/or ibuprofen as needed for headache.  Return if symptoms are getting worse - especially if you start running a fever.

## 2021-04-28 ENCOUNTER — Telehealth: Payer: Self-pay

## 2021-04-28 NOTE — Telephone Encounter (Signed)
Transition Care Management Follow-up Telephone Call Date of discharge and from where: 04/26/2021 from [facility] How have you been since you were released from the hospital? Pt states that she is feeling much better.  Any questions or concerns? No  Items Reviewed: Did the pt receive and understand the discharge instructions provided? Yes  Medications obtained and verified? Yes  Other? No  Any new allergies since your discharge? No  Dietary orders reviewed? No Do you have support at home? Yes   Functional Questionnaire: (I = Independent and D = Dependent) ADLs: I  Bathing/Dressing- I  Meal Prep- I  Eating- I  Maintaining continence- I  Transferring/Ambulation- I  Managing Meds- I   Follow up appointments reviewed:  PCP Hospital f/u appt confirmed? No   Specialist Hospital f/u appt confirmed? No   Are transportation arrangements needed? No  If their condition worsens, is the pt aware to call PCP or go to the Emergency Dept.? No Was the patient provided with contact information for the PCP's office or ED? No Was to pt encouraged to call back with questions or concerns? No

## 2021-05-02 DIAGNOSIS — Z419 Encounter for procedure for purposes other than remedying health state, unspecified: Secondary | ICD-10-CM | POA: Diagnosis not present

## 2021-06-02 DIAGNOSIS — Z419 Encounter for procedure for purposes other than remedying health state, unspecified: Secondary | ICD-10-CM | POA: Diagnosis not present

## 2021-06-16 ENCOUNTER — Encounter: Payer: Self-pay | Admitting: Physician Assistant

## 2021-06-16 ENCOUNTER — Other Ambulatory Visit: Payer: Self-pay

## 2021-06-16 ENCOUNTER — Ambulatory Visit (INDEPENDENT_AMBULATORY_CARE_PROVIDER_SITE_OTHER): Payer: Medicaid Other | Admitting: Physician Assistant

## 2021-06-16 DIAGNOSIS — R200 Anesthesia of skin: Secondary | ICD-10-CM | POA: Diagnosis not present

## 2021-06-16 DIAGNOSIS — G5602 Carpal tunnel syndrome, left upper limb: Secondary | ICD-10-CM | POA: Diagnosis not present

## 2021-06-16 MED ORDER — LIDOCAINE HCL 1 % IJ SOLN
0.5000 mL | INTRAMUSCULAR | Status: AC | PRN
  Administered 2021-06-16: .5 mL

## 2021-06-16 MED ORDER — METHYLPREDNISOLONE ACETATE 40 MG/ML IJ SUSP
20.0000 mg | INTRAMUSCULAR | Status: AC | PRN
  Administered 2021-06-16: 20 mg

## 2021-06-16 NOTE — Progress Notes (Signed)
   Procedure Note  Patient: Kimberly Krause             Date of Birth: October 20, 1996           MRN: [id number]             Visit Date: 06/16/2021 HPI Kimberly Krause returns today to go over the EMG nerve conduction studies of her upper extremities.  She was found to have moderate carpal tunnel syndrome on the left.  She states that the numbness tingling both hands getting worse.  She feels like her right hand is similar to what her left hand was back in February of this year.  She states the left hand is now waking her up several times at night.  Physical exam: Right wrist over the median nerve Tinel's is negative.  Compression test over the median nerve is positive on the right.    Procedures: Visit Diagnoses:  1. Carpal tunnel syndrome, left upper limb   2. Numbness of hand     Hand/UE Inj: R carpal tunnel for carpal tunnel syndrome on 06/16/2021 3:30 PM Medications: 0.5 mL lidocaine 1 %; 20 mg methylPREDNISolone acetate 40 MG/ML   Plan: We will see how she does with the injection on the right emesis with for therapeutic as well as diagnostic purposes.  Regards to the left carpal tunnel syndrome due to that she has moderate symptoms are becoming worse recommend carpal tunnel release.  Discussed risk benefits of surgery with the patient today.  Questions were encouraged and answered by myself and Dr. Magnus Ivan.  We will have her scheduled for a left carpal tunnel release near future.  She follow-up with [place] 2 weeks postop.  Questions encouraged and answered at length

## 2021-06-23 ENCOUNTER — Ambulatory Visit: Payer: Medicaid Other | Admitting: Physician Assistant

## 2021-07-03 DIAGNOSIS — Z419 Encounter for procedure for purposes other than remedying health state, unspecified: Secondary | ICD-10-CM | POA: Diagnosis not present

## 2021-07-17 ENCOUNTER — Other Ambulatory Visit: Payer: Self-pay | Admitting: Orthopaedic Surgery

## 2021-07-17 DIAGNOSIS — G5602 Carpal tunnel syndrome, left upper limb: Secondary | ICD-10-CM | POA: Diagnosis not present

## 2021-07-17 MED ORDER — HYDROCODONE-ACETAMINOPHEN 5-325 MG PO TABS: 1.0000 | ORAL_TABLET | Freq: Four times a day (QID) | ORAL | 0 refills | Status: DC | PRN

## 2021-07-25 ENCOUNTER — Telehealth: Payer: Self-pay | Admitting: Physician Assistant

## 2021-07-25 NOTE — Telephone Encounter (Signed)
Pt wants to know if she can clear the area where she had surgery

## 2021-07-25 NOTE — Telephone Encounter (Signed)
Patient aware she may get her hand wet she can't soak it

## 2021-07-31 ENCOUNTER — Ambulatory Visit (INDEPENDENT_AMBULATORY_CARE_PROVIDER_SITE_OTHER): Payer: Medicaid Other | Admitting: Physician Assistant

## 2021-07-31 ENCOUNTER — Encounter: Payer: Self-pay | Admitting: Physician Assistant

## 2021-07-31 DIAGNOSIS — Z9889 Other specified postprocedural states: Secondary | ICD-10-CM

## 2021-07-31 NOTE — Progress Notes (Signed)
HPI: Kimberly Krause returns today status post left carpal tunnel release 07/17/2021.  She states she is having no numbness tingling in the hand getting longer.  She does note some itching about the incision site.  Some soreness with some movements but overall doing well.  Physical exam: Left hand full motor full sensation.  Surgical incisions well approximated with interrupted nylon sutures.  There is no signs of infection.  Impression: Status post left carpal tunnel release 07/17/2021  Plan: Sutures removed Steri-Strips applied.  She will refrain from any heavy lifting greater than 2 pounds and no gripping with the left hand for the next 4 weeks.  We will see her back in 4 weeks see how she is doing overall.  She will follow-up sooner if there is any concerns and regarding the incision is partial dehiscence or infection.  She is able to wash the hand but refrain from submerging it.  Also having she will work on scar tissue mobilization.  Questions encouraged and answered.

## 2021-08-02 DIAGNOSIS — Z419 Encounter for procedure for purposes other than remedying health state, unspecified: Secondary | ICD-10-CM | POA: Diagnosis not present

## 2021-09-02 DIAGNOSIS — Z419 Encounter for procedure for purposes other than remedying health state, unspecified: Secondary | ICD-10-CM | POA: Diagnosis not present

## 2021-09-09 ENCOUNTER — Ambulatory Visit: Payer: Self-pay | Admitting: Nurse Practitioner

## 2021-09-25 IMAGING — DX DG CERVICAL SPINE COMPLETE 4+V
5 series · 5 of 5 positions shown · non-contrast
Comparison: None.

CLINICAL DATA: One week of bilateral hand numbness and tingling

EXAM:
CERVICAL SPINE - COMPLETE 4+ VIEW

[c-spine lat]
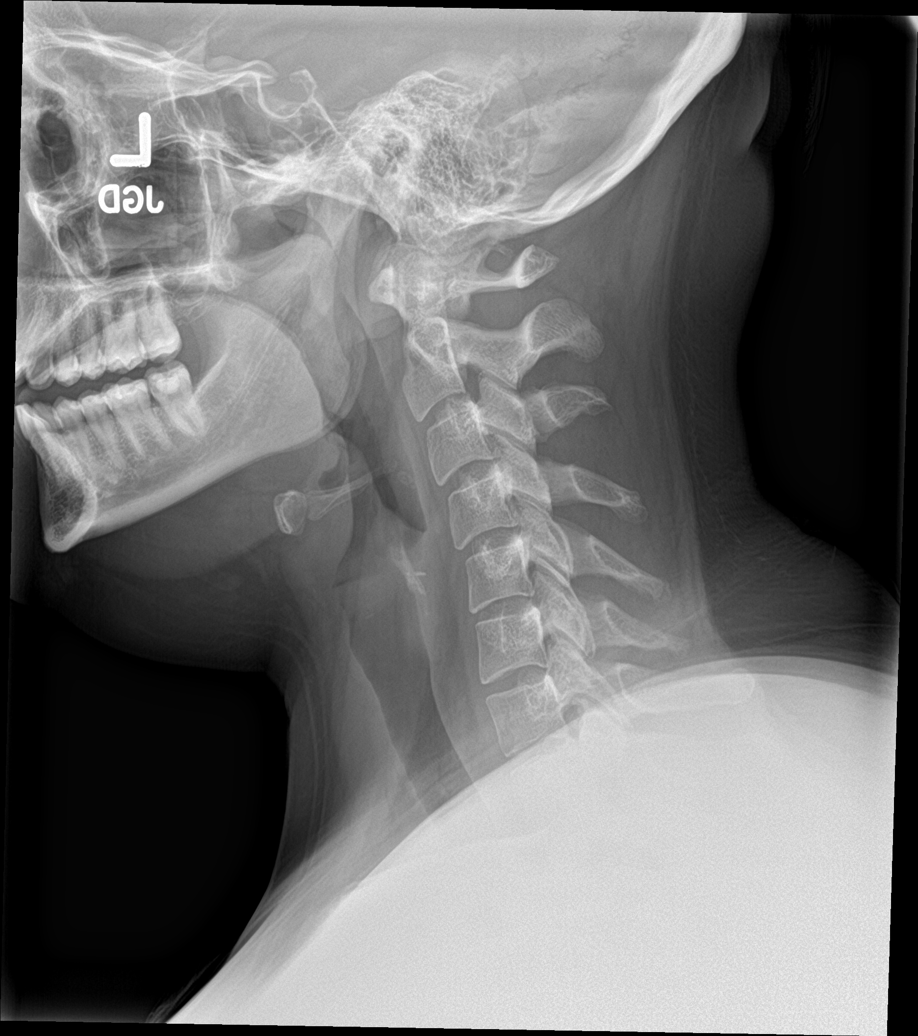

[c-spine obl (1 of 2)]
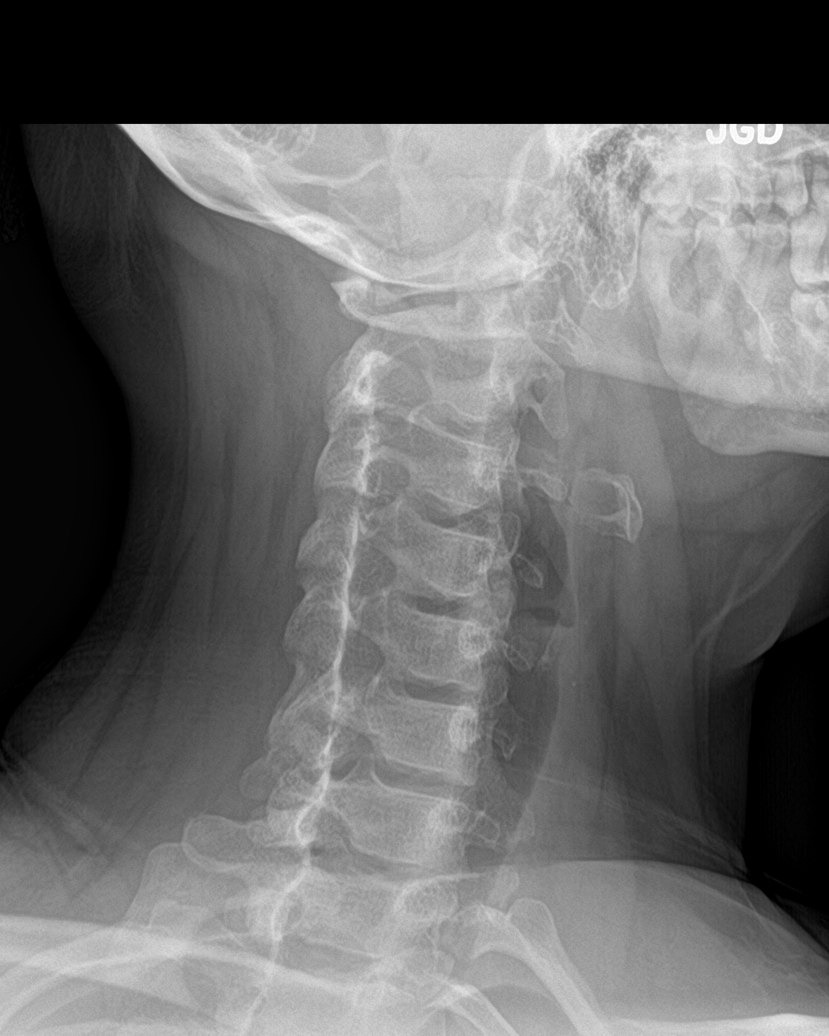

[c-spine obl (2 of 2)]
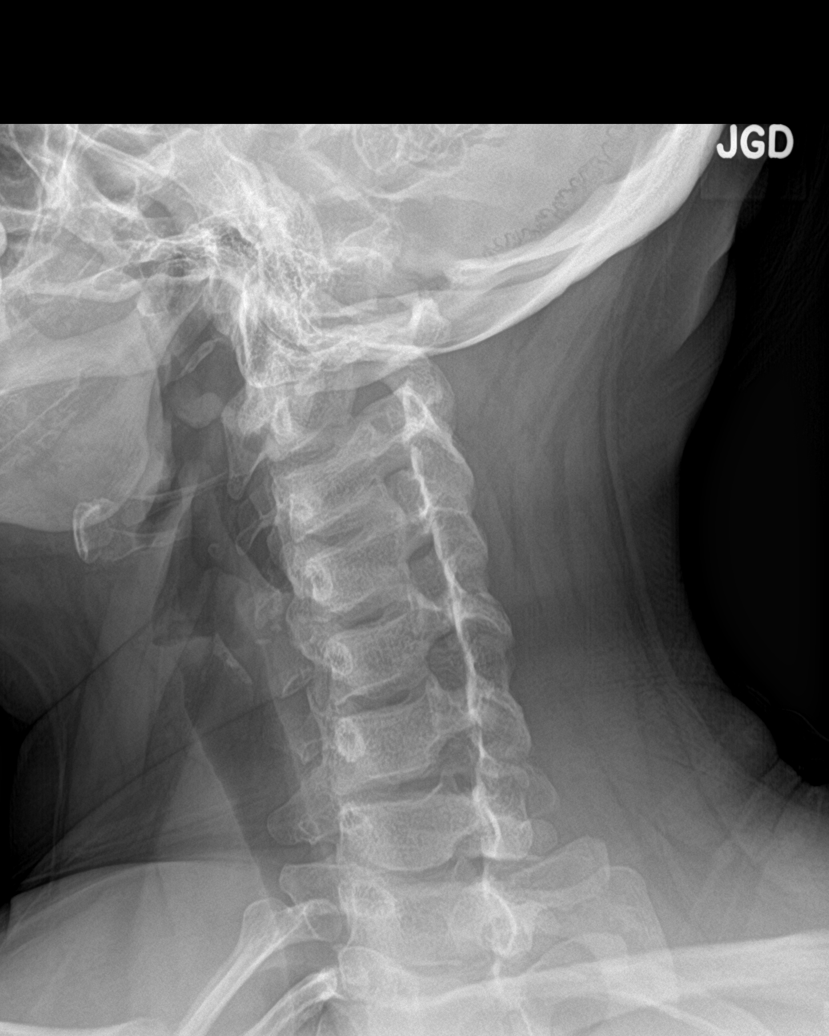

[c-spine ap]
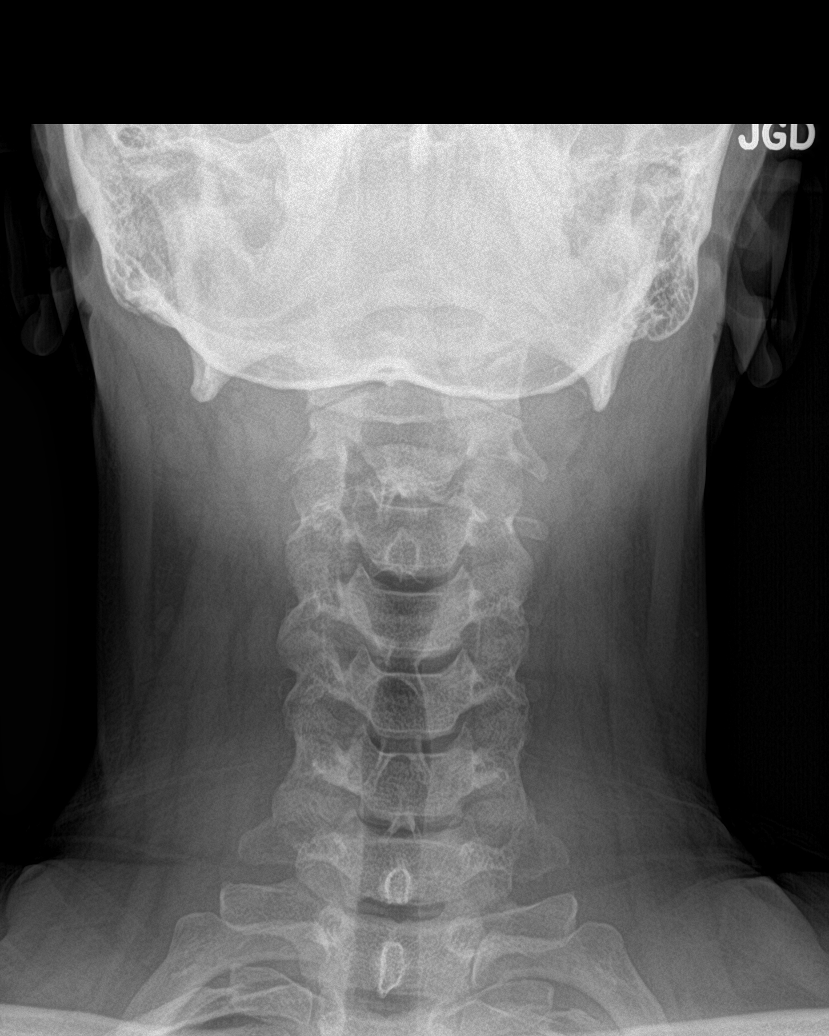

[c-spine open mouth]
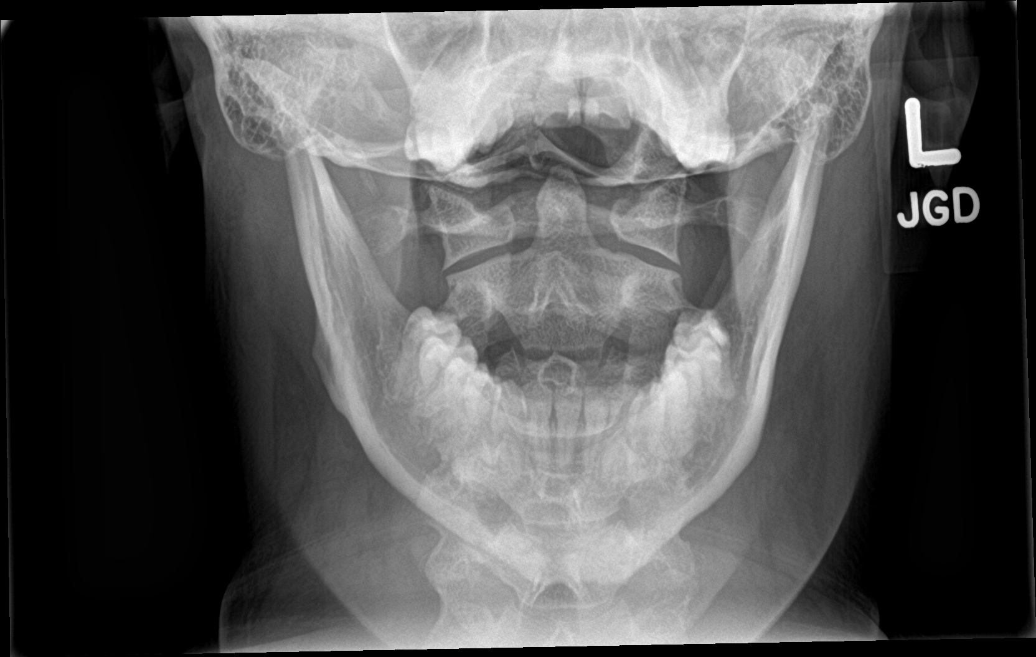

[5 of 5 positions shown; findings below may reference images not displayed]

FINDINGS: 7 cervical type vertebral bodies are visualized on the lateral view
there is no evidence of cervical spine fracture or prevertebral soft
tissue swelling. Alignment is normal. No other significant bone
abnormalities are identified.
IMPRESSION: Negative cervical spine radiographs.

## 2021-09-29 ENCOUNTER — Encounter (HOSPITAL_COMMUNITY): Payer: Self-pay

## 2021-09-29 ENCOUNTER — Other Ambulatory Visit: Payer: Self-pay

## 2021-09-29 ENCOUNTER — Ambulatory Visit (HOSPITAL_COMMUNITY)
Admission: EM | Admit: 2021-09-29 | Discharge: 2021-09-29 | Disposition: A | Discharge status: Home / Self Care | Payer: Medicaid Other | Attending: Student | Admitting: Student

## 2021-09-29 DIAGNOSIS — N3001 Acute cystitis with hematuria: Secondary | ICD-10-CM

## 2021-09-29 LAB — POCT URINALYSIS DIPSTICK, ED / UC
Bilirubin Urine: NEGATIVE
Glucose, UA: NEGATIVE mg/dL
Nitrite: POSITIVE — AB
Protein, ur: 30 mg/dL — AB
Specific Gravity, Urine: 1.025 (ref 1.005–1.030)
Urobilinogen, UA: 0.2 mg/dL (ref 0.0–1.0)
pH: 6 (ref 5.0–8.0)

## 2021-09-29 LAB — POC URINE PREG, ED: Preg Test, Ur: NEGATIVE

## 2021-09-29 MED ORDER — CEPHALEXIN 500 MG PO CAPS: 500.0000 mg | ORAL_CAPSULE | Freq: Four times a day (QID) | ORAL | 0 refills | Status: DC

## 2021-09-29 NOTE — ED Triage Notes (Signed)
Pt presents with urinary frequency and vaginal irritation when urinating X 3 days.

## 2021-09-29 NOTE — ED Provider Notes (Signed)
[facility]    CSN: [id number] Arrival date & time: 09/29/21  0841      History   Chief Complaint Chief Complaint  Patient presents with   UTI    HPI Kimberly Krause is a 25 y.o. female presenting with urinary symptoms x3 days.  Distant medical history of UTI per patient.  Describes urinary frequency, dysuria. Dysuria only with urination. Denies other symptoms like vaginal irritation or itching, vaginal discharge, abdominal pain, flank pain, fever/chills, STI risk.  HPI  History reviewed. No pertinent past medical history.  There are no problems to display for this patient.   History reviewed. No pertinent surgical history.  OB History   No obstetric history on file.      Home Medications    Prior to Admission medications   Medication Sig Start Date End Date Taking? Authorizing Provider  cephALEXin (KEFLEX) 500 MG capsule Take 1 capsule (500 mg total) by mouth 4 (four) times daily. 09/29/21  Yes Rhys Martini, PA-C  HYDROcodone-acetaminophen (NORCO/VICODIN) 5-325 MG tablet Take 1-2 tablets by mouth every 6 (six) hours as needed for moderate pain. 07/17/21   Kathryne Hitch, MD  albuterol (VENTOLIN HFA) 108 (90 Base) MCG/ACT inhaler Inhale 2 puffs into the lungs every 4 (four) hours as needed for wheezing or shortness of breath (or coughing). 04/26/21   Dione Booze, MD  naproxen (NAPROSYN) 500 MG tablet Take 1 tablet (500 mg total) by mouth 2 (two) times daily with a meal. 12/11/20   Wallis Bamberg, PA-C    Family History Family History  Problem Relation Age of Onset   Hypertension Mother    Diabetes Father    Hypertension Father     Social History Social History   Tobacco Use   Smoking status: Every Day    Types: Cigarettes   Smokeless tobacco: Never  Substance Use Topics   Alcohol use: Yes    Comment: Socially   Drug use: Never     Allergies   Sulfa antibiotics   Review of Systems Review of Systems  Constitutional:  Negative for  chills and fever.  HENT:  Negative for sore throat.   Eyes:  Negative for pain and redness.  Respiratory:  Negative for shortness of breath.   Cardiovascular:  Negative for chest pain.  Gastrointestinal:  Negative for abdominal pain, diarrhea, nausea and vomiting.  Genitourinary:  Positive for dysuria and frequency. Negative for decreased urine volume, difficulty urinating, flank pain, genital sores, hematuria, urgency, vaginal bleeding, vaginal discharge and vaginal pain.  Musculoskeletal:  Negative for back pain.  Skin:  Negative for rash.  All other systems reviewed and are negative.   Physical Exam Triage Vital Signs ED Triage Vitals  Enc Vitals Group     BP 09/29/21 0949 128/87     Pulse Rate 09/29/21 0949 90     Resp 09/29/21 0949 18     Temp 09/29/21 0949 98.3 F (36.8 C)     Temp Source 09/29/21 0949 Oral     SpO2 09/29/21 0949 94 %     Weight --      Height --      Head Circumference --      Peak Flow --      Pain Score 09/29/21 0952 5     Pain Loc --      Pain Edu? --      Excl. in GC? --    No data found.  Updated Vital Signs BP 128/87 (BP Location: Left  Arm)   Pulse 90   Temp 98.3 F (36.8 C) (Oral)   Resp 18   LMP 09/02/2021   SpO2 94%   Visual Acuity Right Eye Distance:   Left Eye Distance:   Bilateral Distance:    Right Eye Near:   Left Eye Near:    Bilateral Near:     Physical Exam Vitals reviewed.  Constitutional:      General: She is not in acute distress.    Appearance: Normal appearance. She is not ill-appearing.  HENT:     Head: Normocephalic and atraumatic.     Mouth/Throat:     Mouth: Mucous membranes are moist.     Comments: Moist mucous membranes Eyes:     Extraocular Movements: Extraocular movements intact.     Pupils: Pupils are equal, round, and reactive to light.  Cardiovascular:     Rate and Rhythm: Normal rate and regular rhythm.     Heart sounds: Normal heart sounds.  Pulmonary:     Effort: Pulmonary effort is  normal.     Breath sounds: Normal breath sounds. No wheezing, rhonchi or rales.  Abdominal:     General: Bowel sounds are normal. There is no distension.     Palpations: Abdomen is soft. There is no mass.     Tenderness: There is no abdominal tenderness. There is no right CVA tenderness, left CVA tenderness, guarding or rebound.  Skin:    General: Skin is warm.     Capillary Refill: Capillary refill takes less than 2 seconds.     Comments: Good skin turgor  Neurological:     General: No focal deficit present.     Mental Status: She is alert and oriented to person, place, and time.  Psychiatric:        Mood and Affect: Mood normal.        Behavior: Behavior normal.     UC Treatments / Results  Labs (all labs ordered are listed, but only abnormal results are displayed) Labs Reviewed  POCT URINALYSIS DIPSTICK, ED / UC - Abnormal; Notable for the following components:      Result Value   Ketones, ur TRACE (*)    Hgb urine dipstick SMALL (*)    Protein, ur 30 (*)    Nitrite POSITIVE (*)    Leukocytes,Ua MODERATE (*)    All other components within normal limits  POC URINE PREG, ED    EKG   Radiology No results found.  Procedures Procedures (including critical care time)  Medications Ordered in UC Medications - No data to display  Initial Impression / Assessment and Plan / UC Course  I have reviewed the triage vital signs and the nursing notes.  Pertinent labs & imaging results that were available during my care of the patient were reviewed by me and considered in my medical decision making (see chart for details).     This patient is a very pleasant 25 y.o. year old female presenting with acute cystitis. Afebrile, nontachycardic, no reproducible abd pain or CVAT. Unsure of last UTI.   UA with small blood, positive nitrite, moderate leuk. Culture sent. Has not taken Azo. U-preg negative  Keflex sent.   ED return precautions discussed. Patient verbalizes  understanding and agreement.    Final Clinical Impressions(s) / UC Diagnoses   Final diagnoses:  Acute cystitis with hematuria     Discharge Instructions      -Start the antibiotic: Keflex, 4x daily x5 days. You can take this with  food if you have a sensitive stomach. -Drink plenty of water!    ED Prescriptions     Medication Sig Dispense Auth. Provider   cephALEXin (KEFLEX) 500 MG capsule Take 1 capsule (500 mg total) by mouth 4 (four) times daily. 20 capsule Hazel Sams, PA-C      PDMP not reviewed this encounter.   Hazel Sams, PA-C 09/29/21 1034

## 2021-09-29 NOTE — Discharge Instructions (Addendum)
-  Start the antibiotic: Keflex, 4x daily x5 days. You can take this with food if you have a sensitive stomach. -Drink plenty of water!

## 2021-10-02 DIAGNOSIS — Z419 Encounter for procedure for purposes other than remedying health state, unspecified: Secondary | ICD-10-CM | POA: Diagnosis not present

## 2021-10-06 DIAGNOSIS — J09X2 Influenza due to identified novel influenza A virus with other respiratory manifestations: Secondary | ICD-10-CM | POA: Diagnosis not present

## 2021-11-02 DIAGNOSIS — Z419 Encounter for procedure for purposes other than remedying health state, unspecified: Secondary | ICD-10-CM | POA: Diagnosis not present

## 2021-12-03 DIAGNOSIS — Z419 Encounter for procedure for purposes other than remedying health state, unspecified: Secondary | ICD-10-CM | POA: Diagnosis not present

## 2021-12-31 DIAGNOSIS — Z419 Encounter for procedure for purposes other than remedying health state, unspecified: Secondary | ICD-10-CM | POA: Diagnosis not present

## 2022-01-31 DIAGNOSIS — Z419 Encounter for procedure for purposes other than remedying health state, unspecified: Secondary | ICD-10-CM | POA: Diagnosis not present

## 2022-02-07 IMAGING — DX DG CHEST 2V
2 series · 2 of 2 positions shown · non-contrast
Comparison: None.

CLINICAL DATA: Cough

EXAM:
CHEST - 2 VIEW

[chest pa]
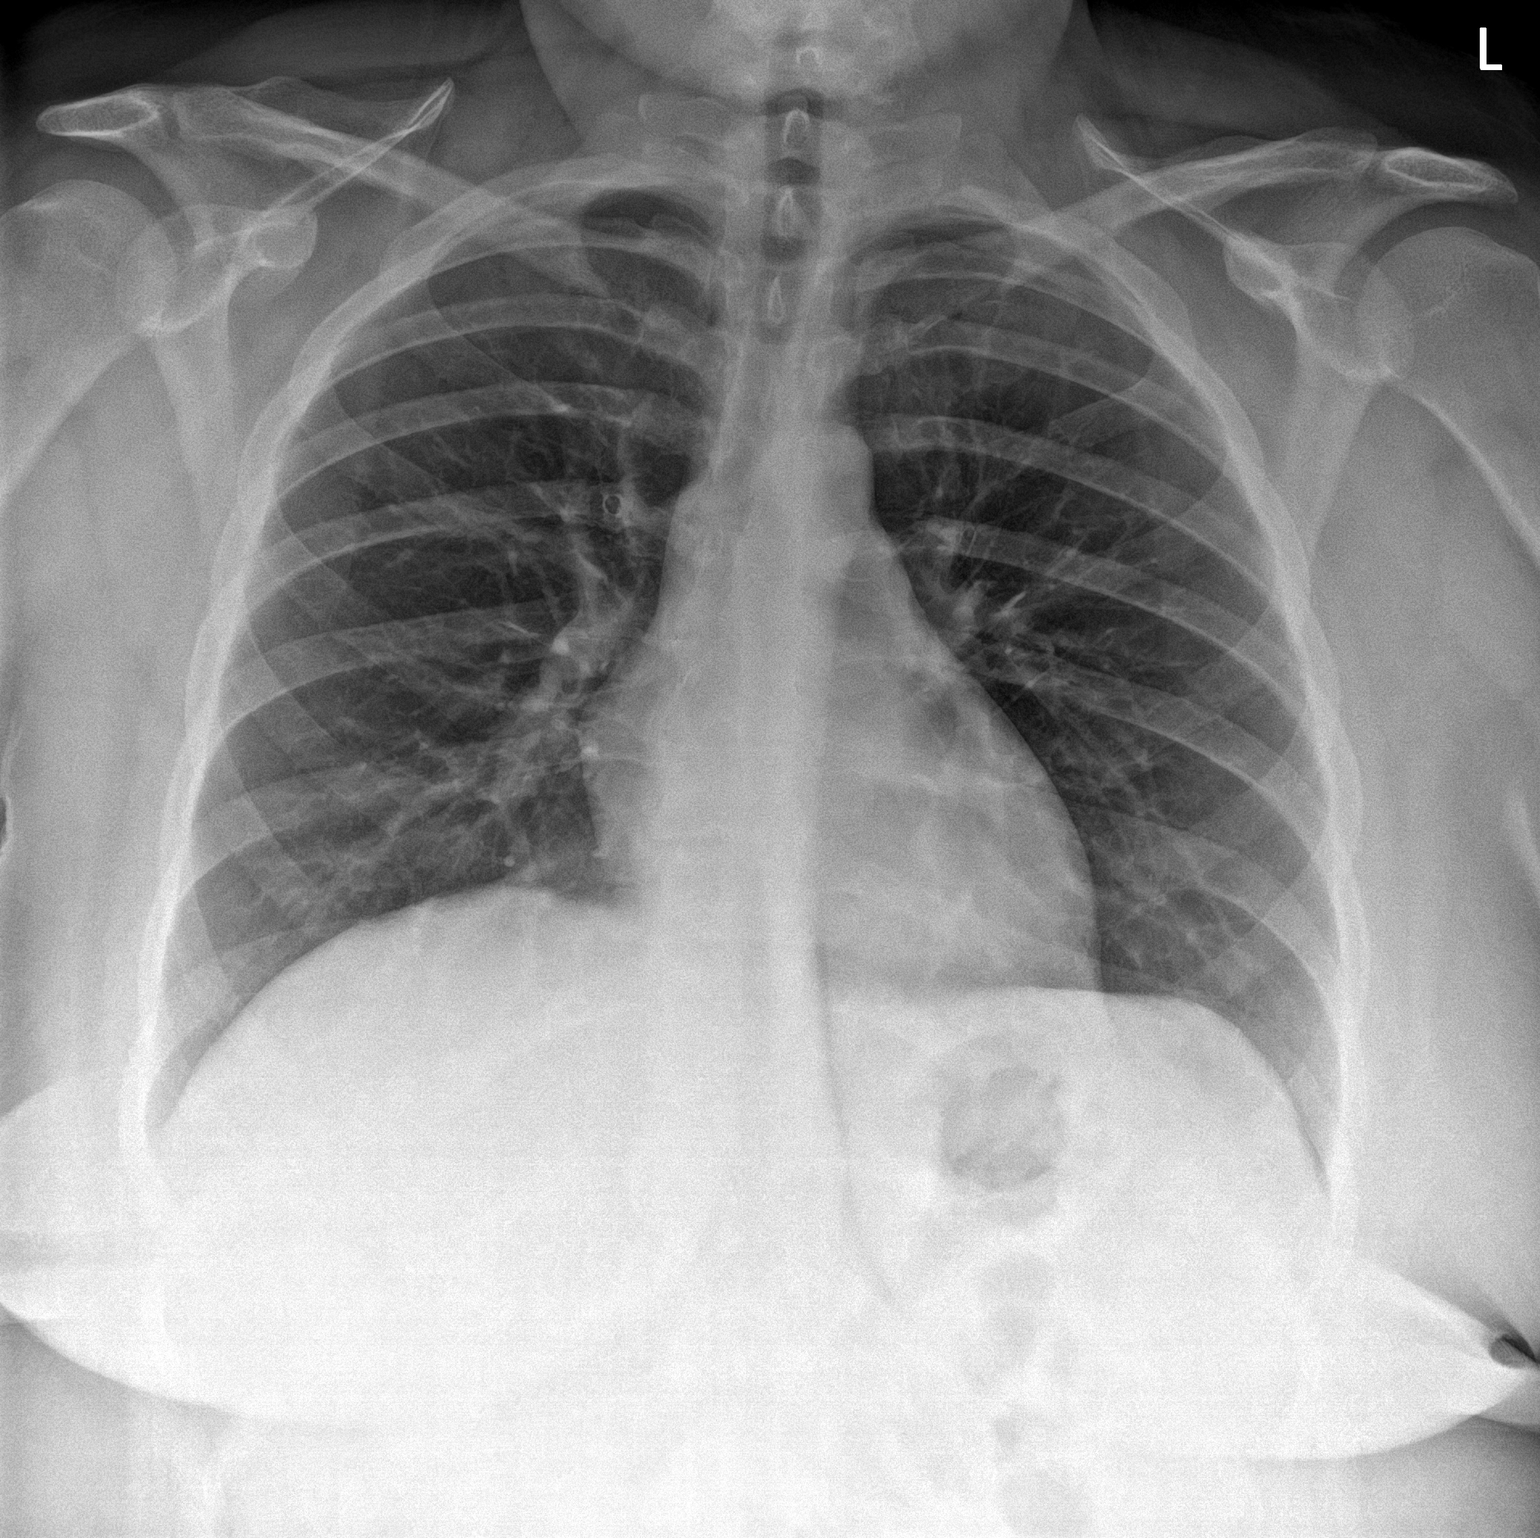

[chest lat]
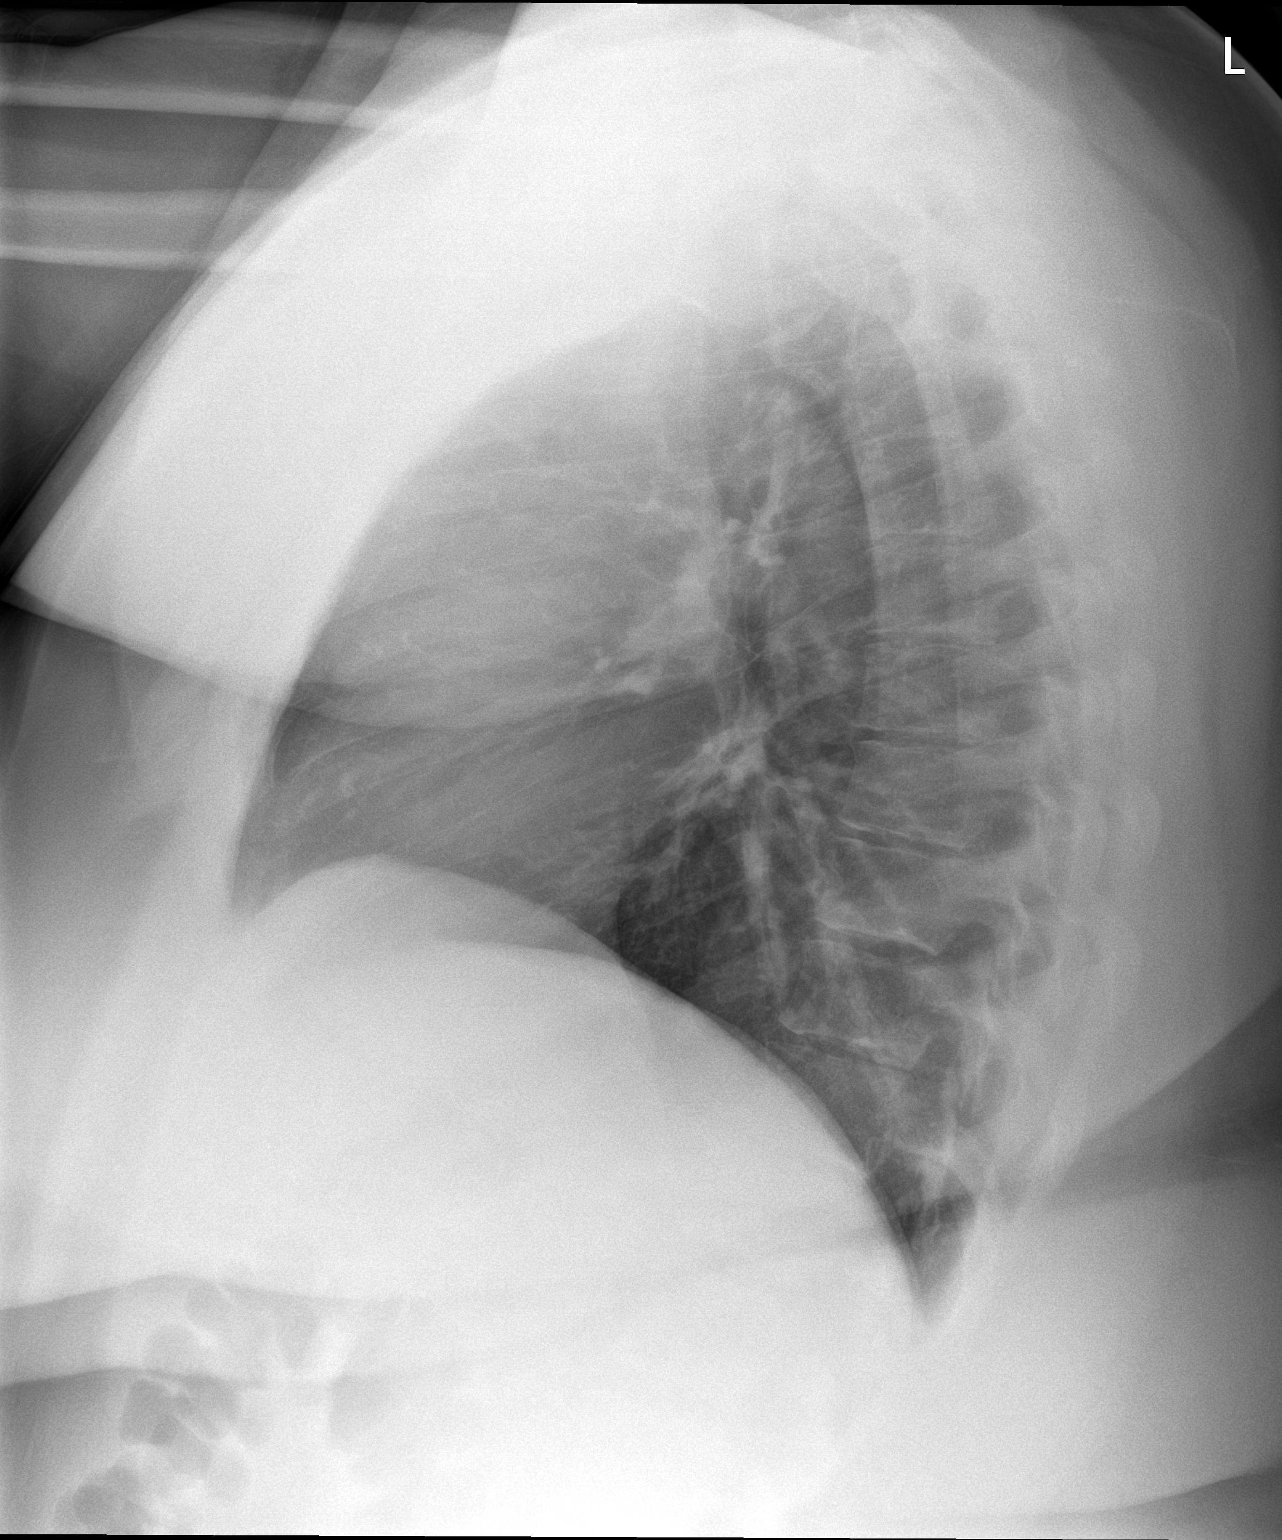

[2 of 2 positions shown; findings below may reference images not displayed]

FINDINGS: The heart size and mediastinal contours are within normal limits.
Both lungs are clear. The visualized skeletal structures are
unremarkable.
IMPRESSION: No active cardiopulmonary disease.

## 2022-02-12 ENCOUNTER — Encounter (HOSPITAL_COMMUNITY): Payer: Self-pay | Admitting: Emergency Medicine

## 2022-02-12 ENCOUNTER — Ambulatory Visit (HOSPITAL_COMMUNITY)
Admission: EM | Admit: 2022-02-12 | Discharge: 2022-02-12 | Disposition: A | Discharge status: Home / Self Care | Payer: Medicaid Other | Attending: Emergency Medicine | Admitting: Emergency Medicine

## 2022-02-12 DIAGNOSIS — J020 Streptococcal pharyngitis: Secondary | ICD-10-CM | POA: Diagnosis not present

## 2022-02-12 LAB — POCT RAPID STREP A, ED / UC: Streptococcus, Group A Screen (Direct): POSITIVE — AB

## 2022-02-12 MED ORDER — PENICILLIN V POTASSIUM 500 MG PO TABS: 500.0000 mg | ORAL_TABLET | Freq: Four times a day (QID) | ORAL | 0 refills | Status: AC

## 2022-02-12 NOTE — ED Triage Notes (Signed)
Pt is present today with a sore throat, nausea, and HA. Pt states sx started x4 days ago   ?

## 2022-02-12 NOTE — ED Provider Notes (Signed)
?[facility] ? ? ?MRN: [id number] DOB: May 18, 1996 ? ?Subjective:  ? ?Chief Complaint;  ?Chief Complaint  ?Patient presents with  ? Sore Throat  ? Headache  ? Nausea  ? ?Pt is present today with a sore throat, nausea, and HA. Pt states sx started x4 days ago   ? ?Ludella Pranger is a 26 y.o. female presenting for symptoms of headache, sore throat, nausea for the last 4 days.  She denies cough, abdominal pain nausea vomiting or dysuria ? ?No current facility-administered medications for this encounter. ? ?Current Outpatient Medications:  ?  HYDROcodone-acetaminophen (NORCO/VICODIN) 5-325 MG tablet, Take 1-2 tablets by mouth every 6 (six) hours as needed for moderate pain., Disp: 30 tablet, Rfl: 0 ?  albuterol (VENTOLIN HFA) 108 (90 Base) MCG/ACT inhaler, Inhale 2 puffs into the lungs every 4 (four) hours as needed for wheezing or shortness of breath (or coughing)., Disp: 1 each, Rfl: 0 ?  cephALEXin (KEFLEX) 500 MG capsule, Take 1 capsule (500 mg total) by mouth 4 (four) times daily., Disp: 20 capsule, Rfl: 0 ?  naproxen (NAPROSYN) 500 MG tablet, Take 1 tablet (500 mg total) by mouth 2 (two) times daily with a meal., Disp: 30 tablet, Rfl: 0  ? ?Allergies  ?Allergen Reactions  ? Sulfa Antibiotics Rash  ? ? ?History reviewed. No pertinent past medical history.  ? ?Review of Systems  ?All other systems reviewed and are negative. ? ? ?Objective:  ? ?Vitals: ?BP 125/86   Pulse (!) 104   Temp 97.9 ?F (36.6 ?C) (Oral)   Resp 18   SpO2 97%  ? ?Physical Exam ?Vitals and nursing note reviewed.  ?Constitutional:   ?   General: She is not in acute distress. ?   Appearance: Normal appearance. She is not toxic-appearing.  ?HENT:  ?   Head: Atraumatic.  ?   Right Ear: External ear normal.  ?   Left Ear: External ear normal.  ?   Nose: Nose normal.  ?   Mouth/Throat:  ?   Mouth: Mucous membranes are moist. No oral lesions.  ?   Pharynx: Pharyngeal swelling and posterior oropharyngeal erythema present. No  oropharyngeal exudate or uvula swelling.  ?Eyes:  ?   General: No scleral icterus.    ?   Right eye: No discharge.     ?   Left eye: No discharge.  ?   Conjunctiva/sclera: Conjunctivae normal.  ?Cardiovascular:  ?   Rate and Rhythm: Normal rate and regular rhythm.  ?Pulmonary:  ?   Effort: Pulmonary effort is normal.  ?   Breath sounds: Normal breath sounds.  ?Abdominal:  ?   General: There is no distension.  ?   Palpations: Abdomen is soft.  ?   Tenderness: There is no abdominal tenderness. There is no right CVA tenderness, left CVA tenderness, guarding or rebound.  ?Skin: ?   General: Skin is warm and dry.  ?   Capillary Refill: Capillary refill takes less than 2 seconds.  ?Neurological:  ?   General: No focal deficit present.  ?   Mental Status: She is alert and oriented to person, place, and time.  ?Psychiatric:     ?   Mood and Affect: Mood normal.  ? ? ?Results for orders placed or performed during the hospital encounter of 02/12/22 (from the past 24 hour(s))  ?POCT Rapid Strep A     Status: Abnormal  ? Collection Time: 02/12/22  7:22 PM  ?Result Value Ref Range  ?  Streptococcus, Group A Screen (Direct) POSITIVE (A) NEGATIVE  ? ? ?No results found.  ?  ? ?Assessment and Plan :  ? ?No diagnosis found. ? ?No orders of the defined types were placed in this encounter. ? ? ?MDM:  ?Keondra Haydu is a 26 y.o. female presenting for sore throat and congestion for the last 3 days not improving.  Patient has mild erythema posterior pharynx without gross swelling or exudate.  Her strep was positive.  I wrote prescription for penicillin and encouraged her to take Motrin or Tylenol for pain and use throat spray.  MDM ? ?Dewaine Conger FNP-C MCN  ?  ?Jone Baseman, NP ?02/12/22 1936 ? ?

## 2022-02-12 NOTE — Discharge Instructions (Signed)
Drink plenty of fluids and take antibiotic as directed.  Take Motrin and Tylenol as needed for pain.  Return at any time if new or worsening symptoms for reevaluation ?

## 2022-03-02 DIAGNOSIS — Z419 Encounter for procedure for purposes other than remedying health state, unspecified: Secondary | ICD-10-CM | POA: Diagnosis not present

## 2022-04-02 DIAGNOSIS — Z419 Encounter for procedure for purposes other than remedying health state, unspecified: Secondary | ICD-10-CM | POA: Diagnosis not present

## 2022-05-02 DIAGNOSIS — Z419 Encounter for procedure for purposes other than remedying health state, unspecified: Secondary | ICD-10-CM | POA: Diagnosis not present

## 2022-06-02 DIAGNOSIS — Z419 Encounter for procedure for purposes other than remedying health state, unspecified: Secondary | ICD-10-CM | POA: Diagnosis not present

## 2022-06-03 ENCOUNTER — Ambulatory Visit (HOSPITAL_COMMUNITY)
Admission: EM | Admit: 2022-06-03 | Discharge: 2022-06-03 | Disposition: A | Discharge status: Home / Self Care | Payer: Medicaid Other | Attending: Family Medicine | Admitting: Family Medicine

## 2022-06-03 ENCOUNTER — Encounter (HOSPITAL_COMMUNITY): Payer: Self-pay

## 2022-06-03 DIAGNOSIS — M25511 Pain in right shoulder: Secondary | ICD-10-CM

## 2022-06-03 MED ORDER — DICLOFENAC SODIUM 75 MG PO TBEC: 75.0000 mg | DELAYED_RELEASE_TABLET | Freq: Two times a day (BID) | ORAL | 0 refills | Status: DC

## 2022-06-03 MED ORDER — PREDNISONE 20 MG PO TABS: 40.0000 mg | ORAL_TABLET | Freq: Every day | ORAL | 0 refills | Status: DC

## 2022-06-03 NOTE — ED Triage Notes (Signed)
Pt c/o rt shoulder pain x2wk, no known injury. States taking ibuprofen and tylenol for pain.

## 2022-06-04 NOTE — ED Provider Notes (Signed)
  [facility]   [id number] 06/03/22 Arrival Time: 1715  ASSESSMENT & PLAN:  1. Acute pain of right shoulder    No indication for plain imaging. Likely strain. Discussed. Activities as tolerated.  Discharge Medication List as of 06/03/2022  6:23 PM     START taking these medications   Details  diclofenac (VOLTAREN) 75 MG EC tablet Take 1 tablet (75 mg total) by mouth 2 (two) times daily., Starting Wed 06/03/2022, Normal    predniSONE (DELTASONE) 20 MG tablet Take 2 tablets (40 mg total) by mouth daily., Starting Wed 06/03/2022, Normal        Recommend:  Follow-up Information     [facility].   Why: If worsening or failing to improve as anticipated. Contact information: [address] [place] [address] [phone]              \  Reviewed expectations re: course of current medical issues. Questions answered. Outlined signs and symptoms indicating need for more acute intervention. Patient verbalized understanding. After Visit Summary given.  SUBJECTIVE: History from: patient. Kimberly Krause is a 26 y.o. female who reports R superior/anterior shoulder pain; x 2 weeks; no trauma. Does lift at work; ques relation. No extremity sensation changes or weakness. Tylenol without much relief.   History reviewed. No pertinent surgical history.    OBJECTIVE:  Vitals:   06/03/22 1749  BP: (!) 154/89  Pulse: 94  Resp: 18  Temp: 98.7 F (37.1 C)  TempSrc: Oral  SpO2: 99%    General appearance: alert; no distress HEENT: [place]; AT Neck: supple with FROM Resp: unlabored respirations Extremities: RUE: warm with well perfused appearance; poorly localized moderate tenderness over right anterior shoulder; without gross deformities; swelling: none; bruising: none; shoulder ROM: normal CV: brisk extremity capillary refill of RUE; 2+ radial pulse of RUE. Skin: warm and dry; no visible rashes Neurologic: gait normal;  normal sensation and strength of RUE Psychological: alert and cooperative; normal mood and affect   Allergies  Allergen Reactions   Sulfa Antibiotics Rash    History reviewed. No pertinent past medical history. Social History   Socioeconomic History   Marital status: Single    Spouse name: Not on file   Number of children: Not on file   Years of education: Not on file   Highest education level: Not on file  Occupational History   Not on file  Tobacco Use   Smoking status: Every Day    Types: Cigarettes   Smokeless tobacco: Never  Substance and Sexual Activity   Alcohol use: Yes    Comment: Socially   Drug use: Never   Sexual activity: Not on file  Other Topics Concern   Not on file  Social History Narrative   Not on file   Social Determinants of Health   Financial Resource Strain: Not on file  Food Insecurity: Not on file  Transportation Needs: Not on file  Physical Activity: Not on file  Stress: Not on file  Social Connections: Not on file   Family History  Problem Relation Age of Onset   Hypertension Mother    Diabetes Father    Hypertension Father    History reviewed. No pertinent surgical history.     Mardella Layman, MD 06/04/22 [phone]

## 2022-07-03 DIAGNOSIS — Z419 Encounter for procedure for purposes other than remedying health state, unspecified: Secondary | ICD-10-CM | POA: Diagnosis not present

## 2022-08-02 DIAGNOSIS — Z419 Encounter for procedure for purposes other than remedying health state, unspecified: Secondary | ICD-10-CM | POA: Diagnosis not present

## 2022-08-11 DIAGNOSIS — N390 Urinary tract infection, site not specified: Secondary | ICD-10-CM | POA: Diagnosis not present

## 2022-09-02 DIAGNOSIS — Z419 Encounter for procedure for purposes other than remedying health state, unspecified: Secondary | ICD-10-CM | POA: Diagnosis not present

## 2022-10-02 DIAGNOSIS — Z419 Encounter for procedure for purposes other than remedying health state, unspecified: Secondary | ICD-10-CM | POA: Diagnosis not present

## 2022-10-07 DIAGNOSIS — J069 Acute upper respiratory infection, unspecified: Secondary | ICD-10-CM | POA: Diagnosis not present

## 2022-11-02 DIAGNOSIS — Z419 Encounter for procedure for purposes other than remedying health state, unspecified: Secondary | ICD-10-CM | POA: Diagnosis not present

## 2022-12-03 DIAGNOSIS — Z419 Encounter for procedure for purposes other than remedying health state, unspecified: Secondary | ICD-10-CM | POA: Diagnosis not present

## 2023-01-01 DIAGNOSIS — Z419 Encounter for procedure for purposes other than remedying health state, unspecified: Secondary | ICD-10-CM | POA: Diagnosis not present

## 2023-02-01 DIAGNOSIS — Z419 Encounter for procedure for purposes other than remedying health state, unspecified: Secondary | ICD-10-CM | POA: Diagnosis not present

## 2023-03-03 DIAGNOSIS — Z419 Encounter for procedure for purposes other than remedying health state, unspecified: Secondary | ICD-10-CM | POA: Diagnosis not present

## 2023-04-03 DIAGNOSIS — Z419 Encounter for procedure for purposes other than remedying health state, unspecified: Secondary | ICD-10-CM | POA: Diagnosis not present

## 2023-05-03 DIAGNOSIS — Z419 Encounter for procedure for purposes other than remedying health state, unspecified: Secondary | ICD-10-CM | POA: Diagnosis not present

## 2023-06-03 DIAGNOSIS — Z419 Encounter for procedure for purposes other than remedying health state, unspecified: Secondary | ICD-10-CM | POA: Diagnosis not present

## 2023-07-04 DIAGNOSIS — Z419 Encounter for procedure for purposes other than remedying health state, unspecified: Secondary | ICD-10-CM | POA: Diagnosis not present

## 2023-08-03 DIAGNOSIS — Z419 Encounter for procedure for purposes other than remedying health state, unspecified: Secondary | ICD-10-CM | POA: Diagnosis not present

## 2023-09-03 DIAGNOSIS — Z419 Encounter for procedure for purposes other than remedying health state, unspecified: Secondary | ICD-10-CM | POA: Diagnosis not present

## 2023-10-03 DIAGNOSIS — Z419 Encounter for procedure for purposes other than remedying health state, unspecified: Secondary | ICD-10-CM | POA: Diagnosis not present

## 2023-11-03 DIAGNOSIS — Z419 Encounter for procedure for purposes other than remedying health state, unspecified: Secondary | ICD-10-CM | POA: Diagnosis not present

## 2023-12-04 DIAGNOSIS — Z419 Encounter for procedure for purposes other than remedying health state, unspecified: Secondary | ICD-10-CM | POA: Diagnosis not present

## 2024-01-01 DIAGNOSIS — Z419 Encounter for procedure for purposes other than remedying health state, unspecified: Secondary | ICD-10-CM | POA: Diagnosis not present

## 2024-01-16 ENCOUNTER — Other Ambulatory Visit: Payer: Self-pay

## 2024-01-16 ENCOUNTER — Emergency Department (HOSPITAL_BASED_OUTPATIENT_CLINIC_OR_DEPARTMENT_OTHER)
Admission: EM | Admit: 2024-01-16 | Discharge: 2024-01-16 | Disposition: A | Discharge status: Home / Self Care | Attending: Emergency Medicine | Admitting: Emergency Medicine

## 2024-01-16 DIAGNOSIS — G932 Benign intracranial hypertension: Secondary | ICD-10-CM | POA: Diagnosis not present

## 2024-01-16 DIAGNOSIS — R519 Headache, unspecified: Secondary | ICD-10-CM | POA: Diagnosis present

## 2024-01-16 DIAGNOSIS — H539 Unspecified visual disturbance: Secondary | ICD-10-CM | POA: Insufficient documentation

## 2024-01-16 DIAGNOSIS — Z79899 Other long term (current) drug therapy: Secondary | ICD-10-CM | POA: Diagnosis not present

## 2024-01-16 NOTE — Discharge Instructions (Signed)
 1.  It is very important that you get follow-up scheduled very soon.  Call Dr. Laban Emperor office tomorrow morning.  Let them know you were seen in the emergency department and your case was reviewed with Dr. Vanessa Barbara and you need a follow-up very soon for further evaluation of visual disturbance. 2.  You need to see a neurologist.  A referral has been placed to [facility].  Call their office tomorrow to schedule a follow-up appointment as soon as possible.  You need further surveillance and management of pseudotumor cerebri. 3.  Return to the emergency department immediately if you have any persisting or worsening visual change, persisting or worsening headache, nausea or vomiting, imbalance or any changes.

## 2024-01-16 NOTE — ED Provider Notes (Signed)
 [place] EMERGENCY DEPARTMENT AT [facility] Provider Note   CSN: [id number] Arrival date & time: 01/16/24  1726     History  Chief Complaint  Patient presents with   Headache    Kimberly Krause is a 28 y.o. female.  HPI Patient reports for the past 3 days she has been getting an intermittent right eye problem with the vision.  She reports it seems like the peripheral vision gets patchy or goes away.  Symptoms might last up to 30 minutes and then resolved.  She reports there have been times when she has been reading and is like half of the images missing.  It is exclusive to the right eye.  She is covered the right and the vision is normal in the left.  He says it is almost like when you look in the sun too long and you temporarily lose vision and then it comes back again.  Patient has history of pseudotumor cerebri.  She reports this does not seem similar.  She reports when she had that her vision was diffusely blurry and persistent, nothing similar to what she is experienced now.  She reports she has had a mild headache for several days.  She reports the headache comes and goes and does not necessarily correlate with the visual symptom.  Headache has been responsive to ibuprofen and there have been no other associated symptoms.    Home Medications Prior to Admission medications   Medication Sig Start Date End Date Taking? Authorizing Provider  albuterol (VENTOLIN HFA) 108 (90 Base) MCG/ACT inhaler Inhale 2 puffs into the lungs every 4 (four) hours as needed for wheezing or shortness of breath (or coughing). 04/26/21   Dione Booze, MD  diclofenac (VOLTAREN) 75 MG EC tablet Take 1 tablet (75 mg total) by mouth 2 (two) times daily. 06/03/22   Mardella Layman, MD  predniSONE (DELTASONE) 20 MG tablet Take 2 tablets (40 mg total) by mouth daily. 06/03/22   Mardella Layman, MD      Allergies    Sulfa antibiotics    Review of Systems   Review of Systems  Physical Exam Updated Vital  Signs BP 113/72   Pulse 85   Temp 98.2 F (36.8 C)   Resp 16   Ht 5\' 5"  (1.651 m)   Wt 127 kg   SpO2 99%   BMI 46.59 kg/m  Physical Exam Constitutional:      Comments: Alert nontoxic well in appearance.  No acute distress.  HENT:     Head: Normocephalic and atraumatic.     Nose: Nose normal.     Mouth/Throat:     Mouth: Mucous membranes are moist.     Pharynx: Oropharynx is clear.  Eyes:     Extraocular Movements: Extraocular movements intact.     Conjunctiva/sclera: Conjunctivae normal.     Pupils: Pupils are equal, round, and reactive to light.     Comments: Funduscopic exam: No visible retinal detachment.  Normal color to the extent of nondilated exam for the retina.  No obvious papilledema.  Cardiovascular:     Rate and Rhythm: Normal rate and regular rhythm.  Pulmonary:     Effort: Pulmonary effort is normal.  Musculoskeletal:        General: No swelling. Normal range of motion.     Cervical back: Neck supple.     Right lower leg: No edema.     Left lower leg: No edema.  Skin:    General: Skin is warm  and dry.  Neurological:     General: No focal deficit present.     Mental Status: She is oriented to person, place, and time.     Motor: No weakness.     Coordination: Coordination normal.  Psychiatric:        Mood and Affect: Mood normal.     ED Results / Procedures / Treatments   Labs (all labs ordered are listed, but only abnormal results are displayed) Labs Reviewed - No data to display  EKG None  Radiology No results found.  Procedures Procedures    Medications Ordered in ED Medications - No data to display  ED Course/ Medical Decision Making/ A&P                                 Medical Decision Making  Patient presents as outlined.  She has known history of pseudotumor cerebri.  She advises the symptoms are not similar to any previous episodes of pseudotumor.  She reports she has had previously multiple lumbar punctures and visual symptoms  have consistently been blurred vision in both eyes as persistent.  She also reports the headache has been different and more mild.  Headache is been episodic and resolves with ibuprofen.  No other associated symptoms.  At this time differential diagnosis includes pseudotumor cerebri\primary ocular disturbance such as retinal detachment or vitreous hemorrhage/atypical migraine with visual disturbance.  Consult: Reviewed with Dr. Vanessa Barbara ophthalmology.  His recommendation is primarily to consider pseudotumor cerebri.  Recommends consultation with neurology to determine if imminent MRI and LP are recommended.  Dr. Vanessa Barbara advises he can see the patient in follow-up at his office this week to further evaluate for primary ocular disturbance.  Consult: Neurology consult pending.  I have reviewed my discussion with ophthalmology with the patient and advised that at this time we will be awaiting neurology consultation.  Patient advises she does not feel like this is consistent with anything she is experienced with pseudotumor and would not be willing to do MRI and LP tonight.  She advises that she will return to the emergency department if she feels that she needs these interventions but prefers to be discharged and follow-up on outpatient basis.  I have counseled that I have placed amatory referral for neurology and is important that she gets established.  She reports that she used to be seen for neurology before she moved to the area couple years ago but never established a provider here.  I also counseled her to contact Dr. Laban Emperor office and get complete dilated eye exam as well.  She voices understanding.  Patient is alert and well in appearance at discharge.        Final Clinical Impression(s) / ED Diagnoses Final diagnoses:  Visual disturbance of one eye  Pseudotumor cerebri    Rx / DC Orders ED Discharge Orders          Ordered    Ambulatory referral to Neurology       Comments: An  appointment is requested in approximately: 1 week   01/16/24 2231              Arby Barrette, MD 01/16/24 2238

## 2024-01-16 NOTE — ED Triage Notes (Signed)
 Pt POV reporting persistent headache and "seeing spots" past few days. Hx migraines and pseudo tumor cerebri, states this feels different.

## 2024-01-18 ENCOUNTER — Encounter (INDEPENDENT_AMBULATORY_CARE_PROVIDER_SITE_OTHER): Payer: Self-pay | Admitting: Ophthalmology

## 2024-01-18 ENCOUNTER — Ambulatory Visit (INDEPENDENT_AMBULATORY_CARE_PROVIDER_SITE_OTHER): Admitting: Ophthalmology

## 2024-01-18 DIAGNOSIS — H3581 Retinal edema: Secondary | ICD-10-CM

## 2024-01-18 DIAGNOSIS — H47393 Other disorders of optic disc, bilateral: Secondary | ICD-10-CM | POA: Diagnosis not present

## 2024-01-18 DIAGNOSIS — H539 Unspecified visual disturbance: Secondary | ICD-10-CM | POA: Diagnosis not present

## 2024-01-18 DIAGNOSIS — G932 Benign intracranial hypertension: Secondary | ICD-10-CM | POA: Diagnosis not present

## 2024-01-18 NOTE — Progress Notes (Signed)
 [facility] - Clinic Note  01/18/2024   CHIEF COMPLAINT Patient presents for Retina Evaluation  HISTORY OF PRESENT ILLNESS: Kimberly Krause is a 28 y.o. female who presents to the clinic today for:  HPI     Retina Evaluation   In both eyes.  This started 5 days ago.  Duration of 5 days.  I, the attending physician,  performed the HPI with the patient and updated documentation appropriately.        Comments   Patient here for Retina Evaluation. Referred from ED. Patient states vision is good today. Recently when looks at a bright light then looks away sees like spots that last thirty minutes. Started 4 - 5 days ago. No eye pain. Not using drops.       Last edited by Rennis Chris, MD on 01/22/2024 12:02 AM.    Pt is here on ED referral for visual disturbance, she states she sees black spots in her vision like when you look at a bright light and look away, she states she cannot predict when they are going to happen, she states she has pseudotumor cerebri and has to get spinal taps, she states she has an appt with a neurologist on Friday, she states her last spinal tap was in 2019, she has not seen a neurologist since she moved to the area, she was being managed by a dr in [place], [place], she used to take diamox, but is off it now, she denies any recent changes in weight  Referring physician: No referring provider defined for this encounter.  HISTORICAL INFORMATION:  Selected notes from the MEDICAL RECORD NUMBER ED f/u for visual disturbance LEE:  Ocular Hx- PMH-   CURRENT MEDICATIONS: No current outpatient medications on file. (Ophthalmic Drugs)   No current facility-administered medications for this visit. (Ophthalmic Drugs)   Current Outpatient Medications (Other)  Medication Sig   ibuprofen (ADVIL) 200 MG tablet Take 800 mg by mouth as needed.   rizatriptan (MAXALT-MLT) 10 MG disintegrating tablet Take 1 tablet (10 mg total) by mouth as needed for  migraine. May repeat in 2 hours if needed   topiramate (TOPAMAX) 50 MG tablet Take 1 tablet (50 mg total) by mouth 2 (two) times daily.   No current facility-administered medications for this visit. (Other)   REVIEW OF SYSTEMS: ROS   Positive for: Eyes Last edited by Laddie Aquas, COA on 01/18/2024  9:53 AM.     ALLERGIES Allergies  Allergen Reactions   Sulfa Antibiotics Rash   PAST MEDICAL HISTORY Past Medical History:  Diagnosis Date   Pseudotumor cerebri 2013   Past Surgical History:  Procedure Laterality Date   CARPAL TUNNEL RELEASE Left 2023   FAMILY HISTORY Family History  Problem Relation Age of Onset   Hypertension Mother    Diabetes Father    Hypertension Father    Pseudotumor cerebri Neg Hx    SOCIAL HISTORY Social History   Tobacco Use   Smoking status: Every Day    Current packs/day: 0.50    Types: Cigarettes   Smokeless tobacco: Never  Vaping Use   Vaping status: Never Used  Substance Use Topics   Alcohol use: Yes    Comment: Socially, not weekly   Drug use: Never       OPHTHALMIC EXAM:  Base Eye Exam     Visual Acuity (Snellen - Linear)       Right Left   Dist [place] 20/20 20/20  Tonometry (Tonopen, 9:50 AM)       Right Left   Pressure 19 19         Pupils       Dark Light Shape React APD   Right 3 2 Round Brisk None   Left 3 2 Round Brisk None         Visual Fields (Counting fingers)       Left Right    Full Full         Extraocular Movement       Right Left    Full, Ortho Full, Ortho         Neuro/Psych     Oriented x3: Yes   Mood/Affect: Normal         Dilation     Both eyes: 1.0% Mydriacyl, 2.5% Phenylephrine @ 9:50 AM           Slit Lamp and Fundus Exam     Slit Lamp Exam       Right Left   Lids/Lashes Normal Normal   Conjunctiva/Sclera White and quiet White and quiet   Cornea Clear Clear   Anterior Chamber deep and clear deep and clear   Iris Round and dilated Round and  dilated   Lens Clear Clear   Anterior Vitreous clear clear         Fundus Exam       Right Left   Disc Pink and Sharp, mild nasal elevation, no edema Pink and Sharp, +elevation   C/D Ratio 0.2 0.2   Macula Flat, Good foveal reflex, No heme or edema Flat, Good foveal reflex, No heme or edema   Vessels mild tortuosity mild tortuosity   Periphery Attached Attached           IMAGING AND PROCEDURES  Imaging and Procedures for 01/18/2024  OCT, Retina - OU - Both Eyes       Right Eye Quality was good. Central Foveal Thickness: 277. Progression has no prior data. Findings include normal foveal contour, no IRF, no SRF, vitreomacular adhesion (Mild disc elevation).   Left Eye Quality was good. Central Foveal Thickness: 278. Progression has no prior data. Findings include normal foveal contour, no IRF, no SRF, vitreomacular adhesion (+disc elevation).   Notes *Images captured and stored on drive  Diagnosis / Impression:  NFP, no IRF/SRF OU +disc elevation OU  Clinical management:  See below  Abbreviations: NFP - Normal foveal profile. CME - cystoid macular edema. PED - pigment epithelial detachment. IRF - intraretinal fluid. SRF - subretinal fluid. EZ - ellipsoid zone. ERM - epiretinal membrane. ORA - outer retinal atrophy. ORT - outer retinal tubulation. SRHM - subretinal hyper-reflective material. IRHM - intraretinal hyper-reflective material           ASSESSMENT/PLAN:   ICD-10-CM   1. Transient vision disturbance of both eyes  H53.9     2. Idiopathic intracranial hypertension  G93.2     3. 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 Retinal edema  H35.81 OCT, Retina - OU - Both Eyes     1-3. Transient visual disturbance OU  - pt reports transient visual obscurations and history of pseudotumor cerebri / IIH  - describes intermittent dark spots in vision -- not positional; spontaneous/unpredictable  - has been lost to Neurology f/u since moving the area  ~4-5 yrs ago  - pt reports current visual symptoms unlike prior symptoms from IIH  - denies positional headaches, pulsatile tinnitus, or nausea/vomiting  -  exam here shows normal dilated exam, just mild disc elevation -- no frank edema  - OCT shows mild disc elevation - no retinal or ophthalmic interventions indicated or recommended   - has Neurology appt later this week  4. No retinal edema on exam or OCT  Ophthalmic Meds Ordered this visit:  No orders of the defined types were placed in this encounter.    Return if symptoms worsen or fail to improve.  There are no Patient Instructions on file for this visit.  Explained the diagnoses, plan, and follow up with the patient and they expressed understanding.  Patient expressed understanding of the importance of proper follow up care.   This document serves as a record of services personally performed by Karie Chimera, MD, PhD. It was created on their behalf by Charlette Caffey, COT an ophthalmic technician. The creation of this record is the provider's dictation and/or activities during the visit.    Electronically signed by:  Charlette Caffey, COT  01/22/24 12:15 AM  This document serves as a record of services personally performed by Karie Chimera, MD, PhD. It was created on their behalf by Glee Arvin. Manson Passey, OA an ophthalmic technician. The creation of this record is the provider's dictation and/or activities during the visit.    Electronically signed by: Glee Arvin. Manson Passey, OA 01/22/24 12:15 AM  Karie Chimera, M.D., Ph.D. Diseases & Surgery of the Retina and Vitreous [facility] 01/18/2024  I have reviewed the above documentation for accuracy and completeness, and I agree with the above. Karie Chimera, M.D., Ph.D. 01/22/24 12:15 AM   Abbreviations: M myopia (nearsighted); A astigmatism; H hyperopia (farsighted); P presbyopia; Mrx spectacle prescription;  CTL contact lenses; OD right eye; OS left  eye; OU both eyes  XT exotropia; ET esotropia; PEK punctate epithelial keratitis; PEE punctate epithelial erosions; DES dry eye syndrome; MGD meibomian gland dysfunction; ATs artificial tears; PFAT's preservative free artificial tears; NSC nuclear sclerotic cataract; PSC posterior subcapsular cataract; ERM epi-retinal membrane; PVD posterior vitreous detachment; RD retinal detachment; DM diabetes mellitus; DR diabetic retinopathy; NPDR non-proliferative diabetic retinopathy; PDR proliferative diabetic retinopathy; CSME clinically significant macular edema; DME diabetic macular edema; dbh dot blot hemorrhages; CWS cotton wool spot; POAG primary open angle glaucoma; C/D cup-to-disc ratio; HVF humphrey visual field; GVF goldmann visual field; OCT optical coherence tomography; IOP intraocular pressure; BRVO Branch retinal vein occlusion; CRVO central retinal vein occlusion; CRAO central retinal artery occlusion; BRAO branch retinal artery occlusion; RT retinal tear; SB scleral buckle; PPV pars plana vitrectomy; VH Vitreous hemorrhage; PRP panretinal laser photocoagulation; IVK intravitreal kenalog; VMT vitreomacular traction; MH Macular hole;  NVD neovascularization of the disc; NVE neovascularization elsewhere; AREDS age related eye disease study; ARMD age related macular degeneration; POAG primary open angle glaucoma; EBMD epithelial/anterior basement membrane dystrophy; ACIOL anterior chamber intraocular lens; IOL intraocular lens; PCIOL posterior chamber intraocular lens; Phaco/IOL phacoemulsification with intraocular lens placement; PRK photorefractive keratectomy; LASIK laser assisted in situ keratomileusis; HTN hypertension; DM diabetes mellitus; COPD chronic obstructive pulmonary disease

## 2024-01-21 ENCOUNTER — Ambulatory Visit: Admitting: Diagnostic Neuroimaging

## 2024-01-21 ENCOUNTER — Encounter: Payer: Self-pay | Admitting: Diagnostic Neuroimaging

## 2024-01-21 VITALS — BP 142/88 | HR 88 | Ht 65.0 in | Wt 328.0 lb

## 2024-01-21 DIAGNOSIS — R519 Headache, unspecified: Secondary | ICD-10-CM | POA: Diagnosis not present

## 2024-01-21 MED ORDER — TOPIRAMATE 50 MG PO TABS: 50.0000 mg | ORAL_TABLET | Freq: Two times a day (BID) | ORAL | 12 refills | Status: DC

## 2024-01-21 MED ORDER — RIZATRIPTAN BENZOATE 10 MG PO TBDP: 10.0000 mg | ORAL_TABLET | ORAL | 11 refills | Status: DC | PRN

## 2024-01-21 NOTE — Progress Notes (Signed)
 [facility]  PATIENT: Kimberly Krause DOB: 1996-08-09  REFERRING CLINICIAN: Arby Barrette, MD HISTORY FROM: patient REASON FOR VISIT: new consult   HISTORICAL  CHIEF COMPLAINT:  Chief Complaint  Patient presents with   RM 7    Patient is here alone for referral for visual disturbance of one eye and pseudotumor cerebri. She states she was diagnosed in 2013. She states she hasn't had any LPs or been on any medication since before 2021. She states she has been having a headache but not so much the visual disturbance anymore. It was spotty vision in R eye. She was on Diamox in the past. She has a sulfa allergy but did not have trouble tolerating Diamox.    HISTORY OF PRESENT ILLNESS:   28 year old female here for evaluation of headaches.  In 2013 patient was diagnosed with idiopathic intracranial hypertension/pseudotumor cerebri, on basis of spinal tap testing.  She was started on Diamox.  Symptoms would fluctuate over time.  Sometimes she would have flareups several times a year and other times she would go several years without symptoms.  She has had about 12 or 13 spinal taps over the years, last 1 in 2020 with opening pressure of 54 cm of water.  Patient was living in [place] and moved to [place] in 2021.  Overall patient had been stable until couple weeks ago when she had some visual disturbance in her right eye and increasing headaches.  She had pain in the front and back of her head.  Went to the emergency room for evaluation.  Also went to ophthalmologist recently, but no significant papilledema was noted.  Patient denies any sensitive light or sound.  No nausea or vomiting.  No throbbing headaches.  Patient does have some excessive daytime sleepiness.  No snoring or apnea noted.  She does wake up with headaches on almost daily basis for last few weeks.  Take some ibuprofen with mild relief.   REVIEW OF SYSTEMS: Full 14 system review of systems  performed and negative with exception of: as per HPI.  ALLERGIES: Allergies  Allergen Reactions   Sulfa Antibiotics Rash    HOME MEDICATIONS: Outpatient Medications Prior to Visit  Medication Sig Dispense Refill   ibuprofen (ADVIL) 200 MG tablet Take 800 mg by mouth as needed.     albuterol (VENTOLIN HFA) 108 (90 Base) MCG/ACT inhaler Inhale 2 puffs into the lungs every 4 (four) hours as needed for wheezing or shortness of breath (or coughing). (Patient not taking: Reported on 01/21/2024) 1 each 0   diclofenac (VOLTAREN) 75 MG EC tablet Take 1 tablet (75 mg total) by mouth 2 (two) times daily. (Patient not taking: Reported on 01/21/2024) 14 tablet 0   predniSONE (DELTASONE) 20 MG tablet Take 2 tablets (40 mg total) by mouth daily. (Patient not taking: Reported on 01/21/2024) 10 tablet 0   No facility-administered medications prior to visit.    PAST MEDICAL HISTORY: Past Medical History:  Diagnosis Date   Pseudotumor cerebri 2013    PAST SURGICAL HISTORY: Past Surgical History:  Procedure Laterality Date   CARPAL TUNNEL RELEASE Left 2023    FAMILY HISTORY: Family History  Problem Relation Age of Onset   Hypertension Mother    Diabetes Father    Hypertension Father    Pseudotumor cerebri Neg Hx     SOCIAL HISTORY: Social History   Socioeconomic History   Marital status: Single    Spouse name: Not on file   Number of children: Not  on file   Years of education: Not on file   Highest education level: Not on file  Occupational History   Not on file  Tobacco Use   Smoking status: Every Day    Current packs/day: 0.50    Types: Cigarettes   Smokeless tobacco: Never  Vaping Use   Vaping status: Never Used  Substance and Sexual Activity   Alcohol use: Yes    Comment: Socially, not weekly   Drug use: Never   Sexual activity: Not on file  Other Topics Concern   Not on file  Social History Narrative   Right handed   Caffeine: reduced to 0-1 cup/day   Lives with her  boyfriend   Social Drivers of Corporate investment banker Strain: Low Risk  (06/25/2019)   Received from [facility]   Overall Financial Resource Strain (CARDIA)    Difficulty of Paying Living Expenses: Not hard at all  Food Insecurity: No Food Insecurity (06/25/2019)   Received from [facility]   Hunger Vital Sign    Worried About Running Out of Food in the Last Year: Never true    Ran Out of Food in the Last Year: Never true  Transportation Needs: No Transportation Needs (06/25/2019)   Received from [facility]   PRAPARE - Transportation    Lack of Transportation (Medical): No    Lack of Transportation (Non-Medical): No  Physical Activity: Inactive (06/25/2019)   Received from [facility]   Exercise Vital Sign    Days of Exercise per Week: 0 days    Minutes of Exercise per Session: 0 min  Stress: No Stress Concern Present (06/25/2019)   Received from [facility] - Occupational Stress Questionnaire    Feeling of Stress : Not at all  Social Connections: Not on file  Intimate Partner Violence: Not At Risk (10/01/2022)   Received from [facility], [facility]   Interpersonal Violence    Interpersonal Violence: No     PHYSICAL EXAM  GENERAL EXAM/CONSTITUTIONAL: Vitals:  Vitals:   01/21/24 1048  BP: (!) 142/88  Pulse: 88  Weight: (!) 328 lb (148.8 kg)  Height: 5\' 5"  (1.651 m)   Body mass index is 54.58 kg/m. Wt Readings from Last 3 Encounters:  01/21/24 (!) 328 lb (148.8 kg)  01/16/24 280 lb (127 kg)  04/25/21 280 lb (127 kg)   Patient is in no distress; well developed, nourished and groomed; neck is supple  CARDIOVASCULAR: Examination of carotid arteries is normal; no carotid bruits Regular rate and rhythm, no murmurs Examination of peripheral vascular system by observation and palpation is normal  EYES: Ophthalmoscopic exam of optic discs and posterior segments is normal; no  papilledema or hemorrhages Vision Screening   Right eye Left eye Both eyes  Without correction 20/30 20/20-1 20/20-1  With correction       MUSCULOSKELETAL: Gait, strength, tone, movements noted in Neurologic exam below  NEUROLOGIC: MENTAL STATUS:      No data to display         awake, alert, oriented to person, place and time recent and remote memory intact normal attention and concentration language fluent, comprehension intact, naming intact fund of knowledge appropriate  CRANIAL NERVE:  2nd - no papilledema on fundoscopic exam 2nd, 3rd, 4th, 6th - pupils equal and reactive to light, visual fields full to confrontation, extraocular muscles intact, no nystagmus 5th - facial sensation symmetric 7th - facial strength symmetric 8th -  hearing intact 9th - palate elevates symmetrically, uvula midline 11th - shoulder shrug symmetric 12th - tongue protrusion midline  MOTOR:  normal bulk and tone, full strength in the BUE, BLE  SENSORY:  normal and symmetric to light touch, temperature, vibration  COORDINATION:  finger-nose-finger, fine finger movements normal  REFLEXES:  deep tendon reflexes present and symmetric  GAIT/STATION:  narrow based gait     DIAGNOSTIC DATA (LABS, IMAGING, TESTING) - I reviewed patient records, labs, notes, testing and imaging myself where available.  Lab Results  Component Value Date   WBC 11.7 (H) 04/25/2021   HGB 13.0 04/25/2021   HCT 39.5 04/25/2021   MCV 84.8 04/25/2021   PLT 277 04/25/2021      Component Value Date/Time   NA 139 04/25/2021 2308   K 4.1 04/25/2021 2308   CL 104 04/25/2021 2308   CO2 26 04/25/2021 2308   GLUCOSE 92 04/25/2021 2308   BUN 10 04/25/2021 2308   CREATININE 0.78 04/25/2021 2308   CALCIUM 8.8 (L) 04/25/2021 2308   PROT 6.5 04/25/2021 2308   ALBUMIN 3.8 04/25/2021 2308   AST 12 (L) 04/25/2021 2308   ALT 13 04/25/2021 2308   ALKPHOS 64 04/25/2021 2308   BILITOT 0.3 04/25/2021 2308    GFRNONAA >60 04/25/2021 2308   No results found for: "CHOL", "HDL", "LDLCALC", "LDLDIRECT", "TRIG", "CHOLHDL" No results found for: "HGBA1C" No results found for: "VITAMINB12" No results found for: "TSH"    ASSESSMENT AND PLAN  28 y.o. year old female here with:   Dx:  1. Worsening headaches     PLAN:  Worsening headaches / right eye vision changes (since March 2025) - check MRI brain (rule out other secondary causes) - trial of topiramate 50mg  twice a day (drink plenty of water) for prevention + rizatriptan 10mg  as needed for rescue  EARLY MORNING HEADACHES / excessive daytime sleepiness / BMI 55 - check sleep study (rule out OSA)  History of idiopathic intracranial hypertension (pseudotumor cerebri)  - no papilledema currently; likely stable, but low threshold to repeat LP - follow up with PCP; consider weight mgmt medication treatment  Orders Placed This Encounter  Procedures   MR BRAIN W WO CONTRAST   Ambulatory referral to Sleep Studies   Meds ordered this encounter  Medications   topiramate (TOPAMAX) 50 MG tablet    Sig: Take 1 tablet (50 mg total) by mouth 2 (two) times daily.    Dispense:  60 tablet    Refill:  12   rizatriptan (MAXALT-MLT) 10 MG disintegrating tablet    Sig: Take 1 tablet (10 mg total) by mouth as needed for migraine. May repeat in 2 hours if needed    Dispense:  9 tablet    Refill:  11   Return in about 4 months (around 05/22/2024) for MyChart visit (15 min).    Suanne Marker, MD 01/21/2024, 11:17 AM Certified in Neurology, Neurophysiology and Neuroimaging  [facility] [address] [place], [place] [address] [phone]

## 2024-01-21 NOTE — Patient Instructions (Addendum)
 Worsening headaches / right eye vision changes (since March 2025) - check MRI brain (rule out other secondary causes) - trial of topiramate 50mg  twice a day (drink plenty of water) for prevention + rizatriptan 10mg  as needed for rescue  EARLY MORNING HEADACHES / excessive daytime sleepiness / BMI 55 - check sleep study (rule out OSA)  History of idiopathic intracranial hypertension (pseudotumor cerebri)  - no papilledema currently; likely stable, but low threshold to repeat LP - follow up with PCP; consider weight mgmt medication treatment

## 2024-01-22 ENCOUNTER — Encounter (INDEPENDENT_AMBULATORY_CARE_PROVIDER_SITE_OTHER): Payer: Self-pay | Admitting: Ophthalmology

## 2024-01-26 ENCOUNTER — Telehealth: Payer: Self-pay | Admitting: Diagnostic Neuroimaging

## 2024-01-26 NOTE — Telephone Encounter (Signed)
 [facility] Kimberly Krause: [id number] exp. 01/25/24-03/25/24 sent to GI [phone]

## 2024-02-08 DIAGNOSIS — Z13220 Encounter for screening for lipoid disorders: Secondary | ICD-10-CM | POA: Diagnosis not present

## 2024-02-08 DIAGNOSIS — Z131 Encounter for screening for diabetes mellitus: Secondary | ICD-10-CM | POA: Diagnosis not present

## 2024-02-08 DIAGNOSIS — Z1389 Encounter for screening for other disorder: Secondary | ICD-10-CM | POA: Diagnosis not present

## 2024-02-08 DIAGNOSIS — G932 Benign intracranial hypertension: Secondary | ICD-10-CM | POA: Diagnosis not present

## 2024-02-09 ENCOUNTER — Ambulatory Visit
Admission: RE | Admit: 2024-02-09 | Discharge: 2024-02-09 | Disposition: A | Discharge status: Home / Self Care | Source: Ambulatory Visit | Attending: Diagnostic Neuroimaging | Admitting: Diagnostic Neuroimaging

## 2024-02-09 DIAGNOSIS — R519 Headache, unspecified: Secondary | ICD-10-CM

## 2024-02-09 DIAGNOSIS — G932 Benign intracranial hypertension: Secondary | ICD-10-CM | POA: Diagnosis not present

## 2024-02-09 MED ORDER — GADOPICLENOL 0.5 MMOL/ML IV SOLN
10.0000 mL | Freq: Once | INTRAVENOUS | Status: AC | PRN
  Administered 2024-02-09: 10 mL via INTRAVENOUS

## 2024-02-10 ENCOUNTER — Encounter: Payer: Self-pay | Admitting: Diagnostic Neuroimaging

## 2024-02-12 DIAGNOSIS — Z419 Encounter for procedure for purposes other than remedying health state, unspecified: Secondary | ICD-10-CM | POA: Diagnosis not present

## 2024-02-16 ENCOUNTER — Ambulatory Visit: Admitting: Neurology

## 2024-02-16 VITALS — BP 148/83 | HR 100 | Ht 65.5 in | Wt 317.2 lb

## 2024-02-16 DIAGNOSIS — G4719 Other hypersomnia: Secondary | ICD-10-CM

## 2024-02-16 DIAGNOSIS — R519 Headache, unspecified: Secondary | ICD-10-CM | POA: Diagnosis not present

## 2024-02-16 DIAGNOSIS — F17200 Nicotine dependence, unspecified, uncomplicated: Secondary | ICD-10-CM

## 2024-02-16 DIAGNOSIS — Z9189 Other specified personal risk factors, not elsewhere classified: Secondary | ICD-10-CM | POA: Diagnosis not present

## 2024-02-16 DIAGNOSIS — R0683 Snoring: Secondary | ICD-10-CM

## 2024-02-16 DIAGNOSIS — R030 Elevated blood-pressure reading, without diagnosis of hypertension: Secondary | ICD-10-CM

## 2024-02-16 DIAGNOSIS — G932 Benign intracranial hypertension: Secondary | ICD-10-CM | POA: Diagnosis not present

## 2024-02-16 DIAGNOSIS — Z6841 Body Mass Index (BMI) 40.0 and over, adult: Secondary | ICD-10-CM

## 2024-02-16 NOTE — Progress Notes (Signed)
 Subjective:    Patient ID: Kimberly Krause is a 27 y.o. female.  HPI    Kimberly Foley, MD, PhD [facility] [address] [address] [place], [place] [address]   Dear Kimberly Krause,  I saw your patient, Kimberly Krause, upon your kind request in my sleep clinic today for initial consultation of her sleep disorder, in particular, concern for underlying obstructive sleep apnea.  The patient is unaccompanied today.  As you know, Ms. Kimberly Krause is a 28 year old female with an underlying medical history of elevated blood pressure, smoking, IIH, recurrent headaches, and morbid obesity with a BMI of over 50, who reports some snoring, morning headaches and excessive daytime somnolence.  Her Epworth sleepiness score is 5 out of 24, fatigue severity score is 31 out of 63.  I reviewed your office note from 01/21/2024.  Snoring is mild per husband.  She has never had a sleep study.  She goes to bed typically between 10 and 10:30 PM and rise time is between 7:30 AM and 8 AM.  She denies nightly nocturia, has occasional morning headaches.  She does not drink caffeine daily.  She is working on smoking cessation, currently smokes about half a pack per day.  She drinks alcohol about once or twice a month.  She lives with her husband and 42 year old daughter.  They have 1 dog in the household.  She has a TV in her bedroom but turns it off before falling asleep.  She works as a Estate manager/land agent.  She has established with a new PCP recently and started taking phentermine.  She has not started Topamax yet but is going to pick up a prescription hopefully in the next couple of days.  Her Past Medical History Is Significant For: Past Medical History:  Diagnosis Date   Pseudotumor cerebri 2013    Her Past Surgical History Is Significant For: Past Surgical History:  Procedure Laterality Date   CARPAL TUNNEL RELEASE Left 2023    Her Family History Is Significant For: Family History  Problem  Relation Age of Onset   Hypertension Mother    Diabetes Father    Hypertension Father    Pseudotumor cerebri Neg Hx     Her Social History Is Significant For: Social History   Socioeconomic History   Marital status: Single    Spouse name: Not on file   Number of children: Not on file   Years of education: Not on file   Highest education level: Not on file  Occupational History   Not on file  Tobacco Use   Smoking status: Every Day    Current packs/day: 0.50    Types: Cigarettes   Smokeless tobacco: Never  Vaping Use   Vaping status: Never Used  Substance and Sexual Activity   Alcohol use: Yes    Comment: Socially, not weekly   Drug use: Never   Sexual activity: Not on file  Other Topics Concern   Not on file  Social History Narrative   Right handed   Caffeine: reduced to 0-1 cup/day   Lives with her boyfriend   Social Drivers of Corporate investment banker Strain: Low Risk  (06/25/2019)   Received from [facility]   Overall Financial Resource Strain (CARDIA)    Difficulty of Paying Living Expenses: Not hard at all  Food Insecurity: No Food Insecurity (06/25/2019)   Received from [facility]   Hunger Vital Sign    Worried About Running Out of Food in the  Last Year: Never true    Ran Out of Food in the Last Year: Never true  Transportation Needs: No Transportation Needs (06/25/2019)   Received from [facility]   PRAPARE - Transportation    Lack of Transportation (Medical): No    Lack of Transportation (Non-Medical): No  Physical Activity: Inactive (06/25/2019)   Received from [facility]   Exercise Vital Sign    Days of Exercise per Week: 0 days    Minutes of Exercise per Session: 0 min  Stress: No Stress Concern Present (06/25/2019)   Received from [facility] of Occupational Health - Occupational Stress Questionnaire    Feeling of Stress : Not at all  Social Connections: Not on file    Her Allergies Are:  Allergies   Allergen Reactions   Sulfa Antibiotics Rash  :   Her Current Medications Are:  Outpatient Encounter Medications as of 02/16/2024  Medication Sig   ibuprofen (ADVIL) 200 MG tablet Take 800 mg by mouth as needed.   rizatriptan (MAXALT-MLT) 10 MG disintegrating tablet Take 1 tablet (10 mg total) by mouth as needed for migraine. May repeat in 2 hours if needed   topiramate (TOPAMAX) 50 MG tablet Take 1 tablet (50 mg total) by mouth 2 (two) times daily.   No facility-administered encounter medications on file as of 02/16/2024.  :   Review of Systems:  Out of a complete 14 point review of systems, all are reviewed and negative with the exception of these symptoms as listed below:    Review of Systems  Neurological:        Sleep consult. Daytime sleepiness,  headaches, has hx IIH. Never was able to get medications.   ESS 5  FSS 31.     Objective:  Neurological Exam  Physical Exam Physical Examination:   Vitals:   02/16/24 0955  BP: (!) 148/83  Pulse: 100    General Examination: The patient is a very pleasant 28 y.o. female in no acute distress. She appears well-developed and well-nourished and well groomed.   HEENT: Normocephalic, atraumatic, pupils are equal, round and reactive to light, extraocular tracking is good without limitation to gaze excursion or nystagmus noted. Hearing is grossly intact. Face is symmetric with normal facial animation. Speech is clear with no dysarthria noted. There is no hypophonia. There is no lip, neck/head, jaw or voice tremor. Neck is supple with full range of passive and active motion. There are no carotid bruits on auscultation. Oropharynx exam reveals: mild mouth dryness, good dental hygiene and mild airway crowding, due to small airway entry, mildly elevated tongue, small tonsils noted, Mallampati class II.  Neck circumference 15-1/2 inches, minimal overbite noted.  Tongue protrudes centrally and palate elevates symmetrically.  Chest: Clear to  auscultation without wheezing, rhonchi or crackles noted.  Heart: S1+S2+0, regular and normal without murmurs, rubs or gallops noted.   Abdomen: Soft, non-tender and non-distended.  Extremities: There is no pitting edema in the distal lower extremities bilaterally.   Skin: Warm and dry without trophic changes noted.   Musculoskeletal: exam reveals no obvious joint deformities.   Neurologically:  Mental status: The patient is awake, alert and oriented in all 4 spheres. Her immediate and remote memory, attention, language skills and fund of knowledge are appropriate. There is no evidence of aphasia, agnosia, apraxia or anomia. Speech is clear with normal prosody and enunciation. Thought process is linear. Mood is normal and affect is normal.  Cranial nerves II - XII  are as described above under HEENT exam.  Motor exam: Normal bulk, strength and tone is noted. There is no obvious action or resting tremor.  Fine motor skills and coordination: grossly intact.  Cerebellar testing: No dysmetria or intention tremor. There is no truncal or gait ataxia.  Sensory exam: intact to light touch in the upper and lower extremities.  Gait, station and balance: She stands easily. No veering to one side is noted. No leaning to one side is noted. Posture is age-appropriate and stance is narrow based. Gait shows normal stride length and normal pace. No problems turning are noted.   Assessment and Plan:  In summary, Dalal Livengood is a very pleasant 28 y.o.-year old female with an underlying medical history of elevated blood pressure, smoking, IIH, recurrent headaches, and morbid obesity with a BMI of over 50, whose history and physical exam are concerning for sleep disordered breathing, particularly obstructive sleep apnea (OSA). A laboratory attended sleep study is typically considered "gold standard" for evaluation of sleep disordered breathing.   I had a long chat with the patient about my findings and the  diagnosis of sleep apnea, particularly OSA, its prognosis and treatment options. We talked about medical/conservative treatments, surgical interventions and non-pharmacological approaches for symptom control. I explained, in particular, the risks and ramifications of untreated moderate to severe OSA, especially with respect to developing cardiovascular disease down the road, including congestive heart failure (CHF), difficult to treat hypertension, cardiac arrhythmias (particularly A-fib), neurovascular complications including TIA, stroke and dementia. Even type 2 diabetes has, in part, been linked to untreated OSA. Symptoms of untreated OSA may include (but may not be limited to) daytime sleepiness, nocturia (i.e. frequent nighttime urination), memory problems, mood irritability and suboptimally controlled or worsening mood disorder such as depression and/or anxiety, lack of energy, lack of motivation, physical discomfort, as well as recurrent headaches, especially morning or nocturnal headaches. We talked about the importance of maintaining a healthy lifestyle and striving for healthy weight.  The importance of complete smoking cessation was also addressed.  In addition, we talked about the importance of striving for and maintaining good sleep hygiene. I recommended a sleep study at this time. I outlined the differences between a laboratory attended sleep study which is considered more comprehensive and accurate over the option of a home sleep test (HST); the latter may lead to underestimation of sleep disordered breathing in some instances and does not help with diagnosing upper airway resistance syndrome and is not accurate enough to diagnose primary central sleep apnea typically. I outlined possible surgical and non-surgical treatment options of OSA, including the use of a positive airway pressure (PAP) device (i.e. CPAP, AutoPAP/APAP or BiPAP in certain circumstances), a custom-made dental device (aka oral  appliance, which would require a referral to a specialist dentist or orthodontist typically, and is generally speaking not considered for patients with full dentures or edentulous state), upper airway surgical options, such as traditional UPPP (which is not considered a first-line treatment) or the Inspire device (hypoglossal nerve stimulator, which would involve a referral for consultation with an ENT surgeon, after careful selection, following inclusion criteria - also not first-line treatment). I explained the PAP treatment option to the patient in detail, as this is generally considered first-line treatment.  The patient indicated that she would be willing to try PAP therapy, if the need arises. I explained the importance of being compliant with PAP treatment, not only for insurance purposes but primarily to improve patient's symptoms symptoms, and for  the patient's long term health benefit, including to reduce Her cardiovascular risks longer-term.    We will pick up our discussion about the next steps and treatment options after testing.  We will keep her posted as to the test results by phone call and/or MyChart messaging where possible.  We will plan to follow-up in sleep clinic accordingly as well.  I answered all her questions today and the patient was in agreement.   I encouraged her to call with any interim questions, concerns, problems or updates or email us  through MyChart.  Generally speaking, sleep test authorizations may take up to 2 weeks, sometimes less, sometimes longer, the patient is encouraged to get in touch with us  if they do not hear back from the sleep lab staff directly within the next 2 weeks.  Thank you very much for allowing me to participate in the care of this nice patient. If I can be of any further assistance to you please do not hesitate to talk to me.  Sincerely,   Debbra Fairy, MD, PhD

## 2024-02-16 NOTE — Patient Instructions (Signed)

## 2024-02-23 ENCOUNTER — Telehealth: Payer: Self-pay | Admitting: Neurology

## 2024-02-23 NOTE — Telephone Encounter (Signed)
 Spoke with the patient.   NPSG MCD Sim Dryer: [id number] (exp. 02/21/24 to 05/21/24)   She is scheduled at [facility] for 03/28/24 at 9 pm.  Mailed packet to the patient & sent mychart.

## 2024-02-29 ENCOUNTER — Ambulatory Visit (INDEPENDENT_AMBULATORY_CARE_PROVIDER_SITE_OTHER): Admitting: Neurology

## 2024-02-29 DIAGNOSIS — G4719 Other hypersomnia: Secondary | ICD-10-CM

## 2024-02-29 DIAGNOSIS — G4720 Circadian rhythm sleep disorder, unspecified type: Secondary | ICD-10-CM

## 2024-02-29 DIAGNOSIS — Z9189 Other specified personal risk factors, not elsewhere classified: Secondary | ICD-10-CM

## 2024-02-29 DIAGNOSIS — G932 Benign intracranial hypertension: Secondary | ICD-10-CM

## 2024-02-29 DIAGNOSIS — F17200 Nicotine dependence, unspecified, uncomplicated: Secondary | ICD-10-CM

## 2024-02-29 DIAGNOSIS — R519 Headache, unspecified: Secondary | ICD-10-CM

## 2024-02-29 DIAGNOSIS — R0683 Snoring: Secondary | ICD-10-CM

## 2024-02-29 DIAGNOSIS — R030 Elevated blood-pressure reading, without diagnosis of hypertension: Secondary | ICD-10-CM

## 2024-03-07 ENCOUNTER — Encounter: Payer: Self-pay | Admitting: Neurology

## 2024-03-07 NOTE — Procedures (Signed)
 Physician Interpretation:     [facility] POLYSOMNOGRAPHY  INTERPRETATION REPORT   STUDY DATE:  02/29/2024     PATIENT NAME:  Kimberly Krause         DATE OF BIRTH:  27-Sep-1996  PATIENT ID:  [id number]    TYPE OF STUDY:  PSG  READING PHYSICIAN: Debbra Fairy, MD, PhD   SCORING TECHNICIAN: Auston Left, RPSGT     Referred by: Dr. Gwendloyn Lemming ? History and Indication for Testing: 28 year old female with an underlying medical history of elevated blood pressure, smoking, IIH, recurrent headaches, and morbid obesity with a BMI of over 50, who reports some snoring, morning headaches and excessive daytime somnolence. Her Epworth sleepiness score is 5 out of 24, fatigue severity score is 31 out of 63. Height: 66 in Weight: 317 lb (BMI 51) Neck Size: 16 in    MEDICATIONS: Advil, Maxalt , Topamax    TECHNICAL DESCRIPTION: A registered sleep technologist was in attendance for the duration of the recording.  Data collection, scoring, video monitoring, and reporting were performed in compliance with the AASM Manual for the Scoring of Sleep and Associated Events; (Hypopnea is scored based on the criteria listed in Section VIII D. 1b in the AASM Manual V2.6 using a 4% oxygen desaturation rule or Hypopnea is scored based on the criteria listed in Section VIII D. 1a in the AASM Manual V2.6 using 3% oxygen desaturation and /or arousal rule).   SLEEP CONTINUITY AND SLEEP ARCHITECTURE:  Lights-out was at 22:09: and lights-on at  05:05:, with a total recording time of 6 hours, 56 min. Total sleep time ( TST) was 292.0 minutes with a decreased sleep efficiency at 70.2%. There was  4.5% REM sleep.  BODY POSITION:  TST was divided  between the following sleep positions: 0.0% supine;  71.9% lateral;  28% prone. Duration of total sleep and percent of total sleep in their respective position is as follows: supine 00 minutes (0%), non-supine 292 minutes (100%); right 26 minutes (9%),  left 183 minutes (63%), and prone 82 minutes (28%).  Total supine REM sleep time was 00 minutes (0% of total REM sleep).  Sleep latency was mildly increased at 23.5 minutes.  REM sleep latency was markedly increased at 326.0 minutes. Of the total sleep time, the percentage of stage N1 sleep was 16.6%, which is markedly increased, stage N2 sleep was 47%, which is normal, stage N3 sleep was 32.4%, which is increased, and REM sleep was 4.5%, which is markedly reduced. Wake after sleep onset (WASO) time accounted for 100.5 minutes with sleep fragmentation noted, ranging from mild to severe.   RESPIRATORY MONITORING:   Based on CMS criteria (using a 4% oxygen desaturation rule for scoring hypopneas), there were 0 apneas (0 obstructive; 0 central; 0 mixed), and 15 hypopneas.  Apnea index was 0.0. Hypopnea index was 3.1. The apnea-hypopnea index was 3.1/hour overall (0.0 supine, 0 non-supine; 0.0 REM, 0.0 supine REM).  There were 0 respiratory effort-related arousals (RERAs).  The RERA index was 0 events/h. Total respiratory disturbance index (RDI) was 3.1 events/h. RDI results showed: supine RDI  0.0 /h; non-supine RDI 3.1 /h; REM RDI 0.0 /h, supine REM RDI 0.0 /h.   Based on AASM criteria (using a 3% oxygen desaturation and /or arousal rule for scoring hypopneas), there were 0 apneas (0 obstructive; 0 central; 0 mixed), and 30 hypopneas. Apnea index was 0.0. Hypopnea index was 6.2. The apnea-hypopnea index was 6.2 overall (0.0 supine, 0 non-supine; 0.0 REM,  0.0 supine REM).  There were 0 respiratory effort-related arousals (RERAs).  The RERA index was 0 events/h. Total respiratory disturbance index (RDI) was 6.2 events/h. RDI results showed: supine RDI  0.0 /h; non-supine RDI 6.2 /h; REM RDI 0.0 /h, supine REM RDI 0.0 /h.  OXIMETRY: Oxyhemoglobin Saturation Nadir during sleep was at  87% from a mean of 95%.  Of the Total sleep time (TST)   hypoxemia (=<88%) was present for  0.1 minutes, or 0.1% of total sleep  time.  LIMB MOVEMENTS: There were 0 periodic limb movements of sleep (0.0/hr), of which 0 (0.0/hr) were associated with an arousal.   AROUSAL: There were 73 arousals in total, for an arousal index of 15 arousals/hour.  Of these, 19 were identified as respiratory-related arousals (4 /h), 0 were PLM-related arousals (0 /h), and 86 were non-specific arousals (18 /h).   EEG: Review of the EEG showed no abnormal electrical discharges and symmetrical bihemispheric findings.      EKG: The EKG revealed normal sinus rhythm (NSR). The average heart rate during sleep was 65 bpm.    AUDIO/VIDEO REVIEW: The audio and video review did not show any abnormal or unusual behaviors, movements, phonations or vocalizations. The patient took one restroom break. Snoring was noted, intermittently, in the mild to moderate range.    POST-STUDY QUESTIONNAIRE: Post study, the patient indicated, that sleep was worse than usual.     IMPRESSION:    1. Primary Snoring 2. Dysfunctions associated with sleep stages or arousal from sleep   RECOMMENDATIONS:    1. This study does not demonstrate any significant obstructive or central sleep disordered breathing with an AHI of less than 5/hour - her AHI was 3.1/hour - and oxygen desaturation nadir was 87% for the night, without any significant time below 89% saturation (less than 1 min). The study was limited, due to decreased sleep efficiency and poor sleep consolidation, absence of supine sleep and significantly reduced REM sleep. Nevertheless, treatment with a positive airway pressure device, such as CPAP or autoPAP is not indicated. Weight loss may aid in reducing her snoring, which was in the mild to moderate range. For disturbing snoring, an oral appliance (through a qualified dentist) can be considered.   2. This study shows sleep fragmentation and abnormal sleep stage percentages; these are nonspecific findings and per se do not signify an intrinsic sleep disorder or a  cause for the patient's sleep-related symptoms. Causes include (but are not limited to) the first night effect of the sleep study, circadian rhythm disturbances, medication effect or an underlying mood disorder or medical problem.  3. The patient should be cautioned not to drive, work at heights, or operate dangerous or heavy equipment when tired or sleepy. Review and reiteration of good sleep hygiene measures should be pursued with any patient. 4. The patient will be advised to follow up with the referring provider, who will be notified of the test results.   I certify that I have reviewed the entire raw data recording prior to the issuance of this report in accordance with the Standards of Accreditation of the American Academy of Sleep Medicine (AASM).  Debbra Fairy, MD, PhD Medical Director, [facility]) Diplomat, ABPN (Neurology and Sleep)               Technical Report:   General Information  Name: Kimberly Krause, Kimberly Krause BMI: 16.01 Physician: Debbra Fairy, MD  ID: [id number] Height: 65.5 in Technician: Auston Left, RPSGT  Sex: Female  Weight: 317.0 lb Record: [id number]  Age: 74 [06-Dec-1995] Date: 02/29/2024    Medical & Medication History    28 year old female with an underlying medical history of elevated blood pressure, smoking, IIH, recurrent headaches, and morbid obesity with a BMI of over 50, who reports some snoring, morning headaches and excessive daytime somnolence. Snoring is mild per husband. Advil, Maxalt , Topamax    Sleep Disorder      Comments   The patient came into the sleep lab for a PSG. One restroom break. EKG kept in NSR with some bradycardia. Mild to moderate snoring. All sleep stages witnessed. Very little REM observed. Respiratory events scored with a 4% desat. The patient slept prone and lateral. AHI was 1.9 after 2 hrs of TST. Fragmented sleep with spontaneous arousals.     Lights out: 10:09:39 PM Lights on: 05:05:53  AM   Time Total Supine Side Prone Upright  Recording (TRT) 6h 56.73m 0h 0.37m 4h 51.87m 2h 5.69m 0h 0.83m  Sleep (TST) 4h 52.73m 0h 0.40m 3h 30.82m 1h 22.10m 0h 0.37m   Latency N1 N2 N3 REM Onset Per. Slp. Eff.  Actual 0h 0.59m 0h 9.45m 1h 34.73m 5h 26.42m 0h 23.41m 1h 43.63m 70.19%   Stg Dur Wake N1 N2 N3 REM  Total 124.0 48.5 136.0 94.5 13.0  Supine 0.0 0.0 0.0 0.0 0.0  Side 81.0 32.5 83.0 94.5 0.0  Prone 43.0 16.0 53.0 0.0 13.0  Upright 0.0 0.0 0.0 0.0 0.0   Stg % Wake N1 N2 N3 REM  Total 29.8 16.6 46.6 32.4 4.5  Supine 0.0 0.0 0.0 0.0 0.0  Side 19.5 11.1 28.4 32.4 0.0  Prone 10.3 5.5 18.2 0.0 4.5  Upright 0.0 0.0 0.0 0.0 0.0     Apnea Summary Sub Supine Side Prone Upright  Total 0 Total 0 0 0 0 0    REM 0 0 0 0 0    NREM 0 0 0 0 0  Obs 0 REM 0 0 0 0 0    NREM 0 0 0 0 0  Mix 0 REM 0 0 0 0 0    NREM 0 0 0 0 0  Cen 0 REM 0 0 0 0 0    NREM 0 0 0 0 0   Rera Summary Sub Supine Side Prone Upright  Total 0 Total 0 0 0 0 0    REM 0 0 0 0 0    NREM 0 0 0 0 0   Hypopnea Summary Sub Supine Side Prone Upright  Total 30 Total 30 0 21 9 0    REM 0 0 0 0 0    NREM 30 0 21 9 0   4% Hypopnea Summary Sub Supine Side Prone Upright  Total (4%) 15 Total 15 0 10 5 0    REM 0 0 0 0 0    NREM 15 0 10 5 0     AHI Total Obs Mix Cen  6.16 Apnea 0.00 0.00 0.00 0.00   Hypopnea 6.16 -- -- --  3.08 Hypopnea (4%) 3.08 -- -- --    Total Supine Side Prone Upright  Position AHI 6.16 0.00 6.00 6.59 0.00  REM AHI 0.00   NREM AHI 6.45   Position RDI 6.16 0.00 6.00 6.59 0.00  REM RDI 0.00   NREM RDI 6.45    4% Hypopnea Total Supine Side Prone Upright  Position AHI (4%) 3.08 0.00 2.86 3.66 0.00  REM AHI (4%) 0.00   NREM AHI (4%) 3.23  Position RDI (4%) 3.08 0.00 2.86 3.66 0.00  REM RDI (4%) 0.00   NREM RDI (4%) 3.23    Desaturation Information Threshold: 2% <100% <90% <80% <70% <60% <50% <40%  Supine 0.0 0.0 0.0 0.0 0.0 0.0 0.0  Side 89.0 0.0 0.0 0.0 0.0 0.0 0.0  Prone 39.0 1.0 0.0 0.0 0.0 0.0  0.0  Upright 0.0 0.0 0.0 0.0 0.0 0.0 0.0  Total 128.0 1.0 0.0 0.0 0.0 0.0 0.0  Index 19.6 0.2 0.0 0.0 0.0 0.0 0.0   Threshold: 3% <100% <90% <80% <70% <60% <50% <40%  Supine 0.0 0.0 0.0 0.0 0.0 0.0 0.0  Side 40.0 0.0 0.0 0.0 0.0 0.0 0.0  Prone 20.0 1.0 0.0 0.0 0.0 0.0 0.0  Upright 0.0 0.0 0.0 0.0 0.0 0.0 0.0  Total 60.0 1.0 0.0 0.0 0.0 0.0 0.0  Index 9.2 0.2 0.0 0.0 0.0 0.0 0.0   Threshold: 4% <100% <90% <80% <70% <60% <50% <40%  Supine 0.0 0.0 0.0 0.0 0.0 0.0 0.0  Side 16.0 0.0 0.0 0.0 0.0 0.0 0.0  Prone 13.0 1.0 0.0 0.0 0.0 0.0 0.0  Upright 0.0 0.0 0.0 0.0 0.0 0.0 0.0  Total 29.0 1.0 0.0 0.0 0.0 0.0 0.0  Index 4.4 0.2 0.0 0.0 0.0 0.0 0.0   Threshold: 3% <100% <90% <80% <70% <60% <50% <40%  Supine 0 0 0 0 0 0 0  Side 40 0 0 0 0 0 0  Prone 20 1 0 0 0 0 0  Upright 0 0 0 0 0 0 0  Total 60 1 0 0 0 0 0   Awakening/Arousal Information # of Awakenings 52  Wake after sleep onset 100.57m  Wake after persistent sleep 58.21m   Arousal Assoc. Arousals Index  Apneas 0 0.0  Hypopneas 19 3.9  Leg Movements 0 0.0  Snore 0 0.0  PTT Arousals 0 0.0  Spontaneous 86 17.7  Total 105 21.6  Leg Movement Information PLMS LMs Index  Total LMs during PLMS 0 0.0  LMs w/ Microarousals 0 0.0   LM LMs Index  w/ Microarousal 0 0.0  w/ Awakening 0 0.0  w/ Resp Event 0 0.0  Spontaneous 0 0.0  Total 0 0.0     Desaturation threshold setting: 3% Minimum desaturation setting: 10 seconds SaO2 nadir: 87% The longest event was a 51 sec obstructive Hypopnea with a minimum SaO2 of 93%. The lowest SaO2 was 87% associated with a 27 sec obstructive Hypopnea. EKG Rates EKG Avg Max Min  Awake 80 102 57  Asleep 65 90 52  EKG Events: Tachycardia

## 2024-03-13 DIAGNOSIS — Z419 Encounter for procedure for purposes other than remedying health state, unspecified: Secondary | ICD-10-CM | POA: Diagnosis not present

## 2024-03-28 ENCOUNTER — Encounter

## 2024-04-13 DIAGNOSIS — Z419 Encounter for procedure for purposes other than remedying health state, unspecified: Secondary | ICD-10-CM | POA: Diagnosis not present

## 2024-05-13 DIAGNOSIS — Z419 Encounter for procedure for purposes other than remedying health state, unspecified: Secondary | ICD-10-CM | POA: Diagnosis not present

## 2024-06-05 ENCOUNTER — Telehealth: Admitting: Diagnostic Neuroimaging

## 2024-06-13 DIAGNOSIS — Z419 Encounter for procedure for purposes other than remedying health state, unspecified: Secondary | ICD-10-CM | POA: Diagnosis not present

## 2024-07-14 DIAGNOSIS — Z419 Encounter for procedure for purposes other than remedying health state, unspecified: Secondary | ICD-10-CM | POA: Diagnosis not present

## 2024-08-20 ENCOUNTER — Emergency Department (HOSPITAL_BASED_OUTPATIENT_CLINIC_OR_DEPARTMENT_OTHER)
Admission: EM | Admit: 2024-08-20 | Discharge: 2024-08-20 | Disposition: A | Discharge status: Home / Self Care | Attending: Emergency Medicine | Admitting: Emergency Medicine

## 2024-08-20 ENCOUNTER — Other Ambulatory Visit: Payer: Self-pay

## 2024-08-20 ENCOUNTER — Emergency Department (HOSPITAL_BASED_OUTPATIENT_CLINIC_OR_DEPARTMENT_OTHER)

## 2024-08-20 ENCOUNTER — Encounter (HOSPITAL_BASED_OUTPATIENT_CLINIC_OR_DEPARTMENT_OTHER): Payer: Self-pay

## 2024-08-20 DIAGNOSIS — R1013 Epigastric pain: Secondary | ICD-10-CM | POA: Insufficient documentation

## 2024-08-20 DIAGNOSIS — R1011 Right upper quadrant pain: Secondary | ICD-10-CM | POA: Insufficient documentation

## 2024-08-20 DIAGNOSIS — R1010 Upper abdominal pain, unspecified: Secondary | ICD-10-CM | POA: Diagnosis present

## 2024-08-20 DIAGNOSIS — D72829 Elevated white blood cell count, unspecified: Secondary | ICD-10-CM | POA: Diagnosis not present

## 2024-08-20 DIAGNOSIS — R1012 Left upper quadrant pain: Secondary | ICD-10-CM | POA: Diagnosis not present

## 2024-08-20 LAB — URINALYSIS, ROUTINE W REFLEX MICROSCOPIC
Bilirubin Urine: NEGATIVE
Glucose, UA: NEGATIVE mg/dL
Hgb urine dipstick: NEGATIVE
Ketones, ur: NEGATIVE mg/dL
Leukocytes,Ua: NEGATIVE
Nitrite: NEGATIVE
Protein, ur: NEGATIVE mg/dL
Specific Gravity, Urine: 1.005 (ref 1.005–1.030)
pH: 6.5 (ref 5.0–8.0)

## 2024-08-20 LAB — COMPREHENSIVE METABOLIC PANEL WITH GFR
ALT: 21 U/L (ref 0–44)
AST: 17 U/L (ref 15–41)
Albumin: 4.3 g/dL (ref 3.5–5.0)
Alkaline Phosphatase: 72 U/L (ref 38–126)
Anion gap: 11 (ref 5–15)
BUN: 7 mg/dL (ref 6–20)
CO2: 26 mmol/L (ref 22–32)
Calcium: 9.6 mg/dL (ref 8.9–10.3)
Chloride: 103 mmol/L (ref 98–111)
Creatinine, Ser: 0.72 mg/dL (ref 0.44–1.00)
GFR, Estimated: >60 mL/min (ref >60)
Glucose, Bld: 96 mg/dL (ref 70–99)
Potassium: 4.4 mmol/L (ref 3.5–5.1)
Sodium: 139 mmol/L (ref 135–145)
Total Bilirubin: 0.3 mg/dL (ref 0.0–1.2)
Total Protein: 7.3 g/dL (ref 6.5–8.1)

## 2024-08-20 LAB — PREGNANCY, URINE: Preg Test, Ur: NEGATIVE

## 2024-08-20 LAB — CBC
HCT: 38.3 % (ref 36.0–46.0)
Hemoglobin: 12.7 g/dL (ref 12.0–15.0)
MCH: 29.2 pg (ref 26.0–34.0)
MCHC: 33.2 g/dL (ref 30.0–36.0)
MCV: 88 fL (ref 80.0–100.0)
Platelets: 244 K/uL (ref 150–400)
RBC: 4.35 MIL/uL (ref 3.87–5.11)
RDW: 14 % (ref 11.5–15.5)
WBC: 11.4 K/uL — ABNORMAL HIGH (ref 4.0–10.5)
nRBC: 0 % (ref 0.0–0.2)

## 2024-08-20 LAB — LIPASE, BLOOD: Lipase: 18 U/L (ref 11–51)

## 2024-08-20 MED ORDER — ALUM & MAG HYDROXIDE-SIMETH 200-200-20 MG/5ML PO SUSP
30.0000 mL | Freq: Once | ORAL | Status: AC
  Administered 2024-08-20: 30 mL via ORAL
  Filled 2024-08-20: qty 30

## 2024-08-20 MED ORDER — IOHEXOL 300 MG/ML  SOLN
125.0000 mL | Freq: Once | INTRAMUSCULAR | Status: AC | PRN
  Administered 2024-08-20: 125 mL via INTRAVENOUS

## 2024-08-20 MED ORDER — LIDOCAINE VISCOUS HCL 2 % MT SOLN
15.0000 mL | Freq: Once | OROMUCOSAL | Status: AC
  Administered 2024-08-20: 15 mL via ORAL
  Filled 2024-08-20: qty 15

## 2024-08-20 MED ORDER — MORPHINE SULFATE (PF) 4 MG/ML IV SOLN
4.0000 mg | Freq: Once | INTRAVENOUS | Status: AC
  Administered 2024-08-20: 4 mg via INTRAVENOUS
  Filled 2024-08-20: qty 1

## 2024-08-20 MED ORDER — FAMOTIDINE 20 MG PO TABS: 20.0000 mg | ORAL_TABLET | Freq: Two times a day (BID) | ORAL | 0 refills | Status: DC

## 2024-08-20 NOTE — Discharge Instructions (Addendum)
 Thank you for letting us  evaluate you today.  Your lab work was unremarkable.  You do not have a UTI.  You are not pregnant.  Your CT imaging did not show any acute abnormalities.  You may be suffering from reflux, gastritis which is irritation of the stomach lining and that would not show up on CT.  We have given you pain medicine, Maalox here Emergency Department for pain.  I have also sent you home with an antiacid medicine to see if this helps with pain.  Please follow-up with gastroenterology for further management  Return to Emergency Department if you experience intractable vomiting, worsening symptoms

## 2024-08-20 NOTE — ED Notes (Signed)
 Pt d/c instructions, medications, and follow-up care reviewed with pt. Pt verbalized understanding and had no further questions at time of d/c. Pt CA&Ox4, ambulatory, and in NAD at time of d/c

## 2024-08-20 NOTE — ED Provider Notes (Signed)
  [place] EMERGENCY DEPARTMENT AT [facility] Provider Note   CSN: [id number] Arrival date & time: 08/20/24  1626     Patient presents with: Abdominal Pain and Back Pain   Kimberly Krause is a 28 y.o. female.  Upper abd pain and thoracic back pain started 2 days ago with back Worsened. Doubled over today at 1400  No urinary symptoms Last BM yesterday. Passing gas No hx of stone Tylenol  wo relief No decreased appetitie. Doesn't hurt woirse after eating. Noth9ing better. Worsens with palpation and layign on stomach No abd surgeries Throbbing and twisting  LMP 08/02/24 and normal. Regular monthly  No VD. Some nausea today  {Add pertinent medical, surgical, social history, OB history to HPI:32947}  Abdominal Pain Back Pain Associated symptoms: abdominal pain        Prior to Admission medications   Medication Sig Start Date End Date Taking? Authorizing Provider  ibuprofen (ADVIL) 200 MG tablet Take 800 mg by mouth as needed.    [provider]  rizatriptan  (MAXALT -MLT) 10 MG disintegrating tablet Take 1 tablet (10 mg total) by mouth as needed for migraine. May repeat in 2 hours if needed 01/21/24   Penumalli, Eduard SAUNDERS, MD  topiramate  (TOPAMAX ) 50 MG tablet Take 1 tablet (50 mg total) by mouth 2 (two) times daily. 01/21/24   Penumalli, Eduard SAUNDERS, MD    Allergies: Sulfa antibiotics    Review of Systems  Gastrointestinal:  Positive for abdominal pain.  Musculoskeletal:  Positive for back pain.    Updated Vital Signs BP (!) 139/96 (BP Location: Right Arm)   Pulse 82   Temp 97.8 F (36.6 C) (Temporal)   Resp 18   SpO2 99%   Physical Exam  (all labs ordered are listed, but only abnormal results are displayed) Labs Reviewed  CBC - Abnormal; Notable for the following components:      Result Value   WBC 11.4 (*)    All other components within normal limits  LIPASE, BLOOD  COMPREHENSIVE METABOLIC PANEL WITH GFR  URINALYSIS, ROUTINE W REFLEX  MICROSCOPIC  PREGNANCY, URINE    EKG: None  Radiology: No results found.  {Document cardiac monitor, telemetry assessment procedure when appropriate:32947} Procedures   Medications Ordered in the ED - No data to display    {Click here for ABCD2, HEART and other calculators REFRESH Note before signing:1}                              Medical Decision Making Amount and/or Complexity of Data Reviewed Labs: ordered.  Risk Prescription drug management.   ***  {Document critical care time when appropriate  Document review of labs and clinical decision tools ie CHADS2VASC2, etc  Document your independent review of radiology images and any outside records  Document your discussion with family members, caretakers and with consultants  Document social determinants of health affecting pt's care  Document your decision making why or why not admission, treatments were needed:32947:::1}   Final diagnoses:  None    ED Discharge Orders     None

## 2024-08-20 NOTE — ED Triage Notes (Signed)
 Patient reports abdominal pain and back pain starting two days ago. Reports it is worse today. She says that she has no urinary symptoms. Denies nausea, vomiting, diarrhea, or constipation.

## 2024-08-25 DIAGNOSIS — R300 Dysuria: Secondary | ICD-10-CM | POA: Diagnosis not present

## 2024-08-25 DIAGNOSIS — Z113 Encounter for screening for infections with a predominantly sexual mode of transmission: Secondary | ICD-10-CM | POA: Diagnosis not present

## 2024-09-03 ENCOUNTER — Emergency Department (HOSPITAL_BASED_OUTPATIENT_CLINIC_OR_DEPARTMENT_OTHER)

## 2024-09-03 ENCOUNTER — Other Ambulatory Visit: Payer: Self-pay

## 2024-09-03 ENCOUNTER — Emergency Department (HOSPITAL_BASED_OUTPATIENT_CLINIC_OR_DEPARTMENT_OTHER)
Admission: EM | Admit: 2024-09-03 | Discharge: 2024-09-03 | Disposition: A | Discharge status: Home / Self Care | Attending: Emergency Medicine | Admitting: Emergency Medicine

## 2024-09-03 DIAGNOSIS — R1013 Epigastric pain: Secondary | ICD-10-CM | POA: Insufficient documentation

## 2024-09-03 DIAGNOSIS — R509 Fever, unspecified: Secondary | ICD-10-CM | POA: Diagnosis not present

## 2024-09-03 DIAGNOSIS — K8020 Calculus of gallbladder without cholecystitis without obstruction: Secondary | ICD-10-CM | POA: Diagnosis not present

## 2024-09-03 DIAGNOSIS — R112 Nausea with vomiting, unspecified: Secondary | ICD-10-CM | POA: Insufficient documentation

## 2024-09-03 DIAGNOSIS — M549 Dorsalgia, unspecified: Secondary | ICD-10-CM | POA: Insufficient documentation

## 2024-09-03 DIAGNOSIS — R1010 Upper abdominal pain, unspecified: Secondary | ICD-10-CM | POA: Diagnosis present

## 2024-09-03 DIAGNOSIS — R1011 Right upper quadrant pain: Secondary | ICD-10-CM | POA: Diagnosis not present

## 2024-09-03 DIAGNOSIS — K8080 Other cholelithiasis without obstruction: Secondary | ICD-10-CM

## 2024-09-03 LAB — CBC
HCT: 39.4 % (ref 36.0–46.0)
Hemoglobin: 13 g/dL (ref 12.0–15.0)
MCH: 29.4 pg (ref 26.0–34.0)
MCHC: 33 g/dL (ref 30.0–36.0)
MCV: 89.1 fL (ref 80.0–100.0)
Platelets: 273 K/uL (ref 150–400)
RBC: 4.42 MIL/uL (ref 3.87–5.11)
RDW: 14.5 % (ref 11.5–15.5)
WBC: 13 K/uL — ABNORMAL HIGH (ref 4.0–10.5)
nRBC: 0 % (ref 0.0–0.2)

## 2024-09-03 LAB — COMPREHENSIVE METABOLIC PANEL WITH GFR
ALT: 45 U/L — ABNORMAL HIGH (ref 0–44)
AST: 69 U/L — ABNORMAL HIGH (ref 15–41)
Albumin: 3.8 g/dL (ref 3.5–5.0)
Alkaline Phosphatase: 69 U/L (ref 38–126)
Anion gap: 6 (ref 5–15)
BUN: 11 mg/dL (ref 6–20)
CO2: 27 mmol/L (ref 22–32)
Calcium: 8.8 mg/dL — ABNORMAL LOW (ref 8.9–10.3)
Chloride: 105 mmol/L (ref 98–111)
Creatinine, Ser: 0.63 mg/dL (ref 0.44–1.00)
GFR, Estimated: >60 mL/min (ref >60)
Glucose, Bld: 94 mg/dL (ref 70–99)
Potassium: 4.1 mmol/L (ref 3.5–5.1)
Sodium: 138 mmol/L (ref 135–145)
Total Bilirubin: 0.4 mg/dL (ref 0.0–1.2)
Total Protein: 6.5 g/dL (ref 6.5–8.1)

## 2024-09-03 LAB — LIPASE, BLOOD: Lipase: 15 U/L (ref 11–51)

## 2024-09-03 LAB — URINALYSIS, ROUTINE W REFLEX MICROSCOPIC
Bacteria, UA: NONE SEEN
Bilirubin Urine: NEGATIVE
Glucose, UA: NEGATIVE mg/dL
Ketones, ur: NEGATIVE mg/dL
Nitrite: NEGATIVE
RBC / HPF: >50 RBC/hpf (ref 0–5)
Specific Gravity, Urine: 1.026 (ref 1.005–1.030)
pH: 6 (ref 5.0–8.0)

## 2024-09-03 LAB — PREGNANCY, URINE: Preg Test, Ur: NEGATIVE

## 2024-09-03 MED ORDER — HYDROMORPHONE HCL 1 MG/ML IJ SOLN
0.5000 mg | Freq: Once | INTRAMUSCULAR | Status: AC
  Administered 2024-09-03: 0.5 mg via INTRAVENOUS
  Filled 2024-09-03: qty 1

## 2024-09-03 MED ORDER — SODIUM CHLORIDE 0.9 % IV BOLUS
1000.0000 mL | Freq: Once | INTRAVENOUS | Status: AC
  Administered 2024-09-03: 1000 mL via INTRAVENOUS

## 2024-09-03 MED ORDER — KETOROLAC TROMETHAMINE 15 MG/ML IJ SOLN
15.0000 mg | Freq: Once | INTRAMUSCULAR | Status: AC
  Administered 2024-09-03: 15 mg via INTRAVENOUS
  Filled 2024-09-03: qty 1

## 2024-09-03 MED ORDER — IOHEXOL 300 MG/ML  SOLN
100.0000 mL | Freq: Once | INTRAMUSCULAR | Status: AC | PRN
  Administered 2024-09-03: 100 mL via INTRAVENOUS

## 2024-09-03 NOTE — ED Notes (Addendum)
 DC paperwork given and verbally understood.... Pt aware of no drinking/driving due to the meds given.SABRASABRA

## 2024-09-03 NOTE — ED Provider Notes (Signed)
 Patient is known to me by Dr. Trine pending results of ultrasound which was positive for cholelithiasis.  She is pain-free at this time.  Will give referral to surgery on-call   Dasie Faden, MD 09/03/24 [phone]

## 2024-09-03 NOTE — ED Triage Notes (Signed)
 Pt POV reporting persistent abd/flank pain, seen for same 10/19, dx UTI, completed abx. Endorsing fever and emesis x1 today.

## 2024-09-03 NOTE — ED Provider Notes (Signed)
 [place] EMERGENCY DEPARTMENT AT [facility] Provider Note  CSN: [id number] Arrival date & time: 09/03/24 0145  Chief Complaint(s) Abdominal Pain  HPI Averill Winters is a 28 y.o. female who presents to the emergency department with persistent upper abdominal and back pain right greater than left.  Worse with ambulation and range of motion.  She denies any fall or trauma.  Today she endorses low-grade fever with nausea and nonbloody nonbilious emesis.  Reports that she was seen on October 19 and had a reassuring workup.  2 days later she saw urgent care for the same and was told she had urinary tract infection and prescribed antibiotics.  Patient also diagnosed with bacterial vaginosis and prescribed Flagyl.  She has completed these antibiotics.  The history is provided by the patient.    Past Medical History Past Medical History:  Diagnosis Date  . Pseudotumor cerebri 2013   There are no active problems to display for this patient.  Home Medication(s) Prior to Admission medications   Medication Sig Start Date End Date Taking? Authorizing Provider  famotidine (PEPCID) 20 MG tablet Take 1 tablet (20 mg total) by mouth 2 (two) times daily. 08/20/24   Minnie Tinnie BRAVO, PA  ibuprofen (ADVIL) 200 MG tablet Take 800 mg by mouth as needed.    [provider]  rizatriptan  (MAXALT -MLT) 10 MG disintegrating tablet Take 1 tablet (10 mg total) by mouth as needed for migraine. May repeat in 2 hours if needed 01/21/24   Penumalli, Vikram R, MD  topiramate  (TOPAMAX ) 50 MG tablet Take 1 tablet (50 mg total) by mouth 2 (two) times daily. 01/21/24   Penumalli, Eduard SAUNDERS, MD                                                                                                                                    Allergies Sulfa antibiotics  Review of Systems Review of Systems As noted in HPI  Physical Exam Vital Signs  I have reviewed the triage vital signs BP 113/78   Pulse 75   Temp  97.9 F (36.6 C) (Oral)   Resp 17   SpO2 97%   Physical Exam Vitals reviewed.  Constitutional:      General: She is not in acute distress.    Appearance: She is well-developed. She is obese. She is not diaphoretic.  HENT:     Head: Normocephalic and atraumatic.     Right Ear: External ear normal.     Left Ear: External ear normal.     Nose: Nose normal.  Eyes:     General: No scleral icterus.    Conjunctiva/sclera: Conjunctivae normal.  Neck:     Trachea: Phonation normal.  Cardiovascular:     Rate and Rhythm: Normal rate and regular rhythm.  Pulmonary:     Effort: Pulmonary effort is normal. No respiratory distress.     Breath sounds: No stridor.  Abdominal:  General: There is no distension.     Tenderness: There is abdominal tenderness in the epigastric area and suprapubic area. There is right CVA tenderness.  Musculoskeletal:        General: Normal range of motion.     Cervical back: Normal range of motion.  Neurological:     Mental Status: She is alert and oriented to person, place, and time.  Psychiatric:        Behavior: Behavior normal.     ED Results and Treatments Labs (all labs ordered are listed, but only abnormal results are displayed) Labs Reviewed  CBC - Abnormal; Notable for the following components:      Result Value   WBC 13.0 (*)    All other components within normal limits  URINALYSIS, ROUTINE W REFLEX MICROSCOPIC - Abnormal; Notable for the following components:   Hgb urine dipstick LARGE (*)    Protein, ur TRACE (*)    Leukocytes,Ua TRACE (*)    All other components within normal limits  COMPREHENSIVE METABOLIC PANEL WITH GFR - Abnormal; Notable for the following components:   Calcium 8.8 (*)    AST 69 (*)    ALT 45 (*)    All other components within normal limits  PREGNANCY, URINE  LIPASE, BLOOD                                                                                                                         EKG  EKG  Interpretation Date/Time:    Ventricular Rate:    PR Interval:    QRS Duration:    QT Interval:    QTC Calculation:   R Axis:      Text Interpretation:         Radiology CT ABDOMEN PELVIS W CONTRAST Result Date: 09/03/2024 EXAM: CT ABDOMEN AND PELVIS WITH CONTRAST 09/03/2024 06:43:56 AM TECHNIQUE: CT of the abdomen and pelvis was performed with the administration of 100 mL of iohexol (OMNIPAQUE) 300 MG/ML solution. Multiplanar reformatted images are provided for review. Automated exposure control, iterative reconstruction, and/or weight-based adjustment of the mA/kV was utilized to reduce the radiation dose to as low as reasonably achievable. COMPARISON: CT of the abdomen and pelvis dated 08/20/2024. CLINICAL HISTORY: Abdominal pain, acute, nonlocalized. FINDINGS: LOWER CHEST: Minimal atelectasis present independently within the lower lobes bilaterally. LIVER: The liver is unremarkable. GALLBLADDER AND BILE DUCTS: Mild calcification of the gallbladder fundus. There is no evidence of cholecystitis. No biliary ductal dilatation. SPLEEN: No acute abnormality. PANCREAS: No acute abnormality. ADRENAL GLANDS: No acute abnormality. KIDNEYS, URETERS AND BLADDER: No stones in the kidneys or ureters. No hydronephrosis. No perinephric or periureteral stranding. Urinary bladder is unremarkable. GI AND BOWEL: Stomach demonstrates no acute abnormality. There is no bowel obstruction. PERITONEUM AND RETROPERITONEUM: No ascites. No free air. VASCULATURE: Aorta is normal in caliber. LYMPH NODES: No lymphadenopathy. REPRODUCTIVE ORGANS: No acute abnormality. BONES AND SOFT TISSUES: Mild levoscoliosis of the lumbar spine. No acute osseous abnormality. No focal soft  tissue abnormality. IMPRESSION: 1. No acute findings in the abdomen or pelvis. 2. Mild calcification of the gallbladder fundus without evidence of cholecystitis. 3. Mild dependent atelectasis in the bilateral lower lobes. 4. Mild lumbar levoscoliosis.  Electronically signed by: Evalene Coho MD 09/03/2024 06:50 AM EST RP Workstation: [id number]    Medications Ordered in ED Medications  ketorolac (TORADOL) 15 MG/ML injection 15 mg (15 mg Intravenous Given 09/03/24 0455)  iohexol (OMNIPAQUE) 300 MG/ML solution 100 mL (100 mLs Intravenous Contrast Given 09/03/24 9360)   Procedures Procedures  (including critical care time) Medical Decision Making / ED Course   Medical Decision Making Amount and/or Complexity of Data Reviewed Labs: ordered. Decision-making details documented in ED Course. Radiology: ordered and independent interpretation performed. Decision-making details documented in ED Course.  Risk Prescription drug management.     Clinical Course as of 09/03/24 9266  [facility] Sep 03, 2024  0654 Upper abdominal pain and back pain.  Differential diagnosis considered.  CBC with slightly worsening leukocytosis from prior. CMP without significant electrolyte derangements or renal sufficiency.  Mildly elevated LFTs when compared to prior.  No bili obstruction. UPT negative ruling out pregnancy related process. UA with hematuria likely contaminated from patient's menstrual cycle.  No bacteria concerning for infection. Repeat CT scan notable for calcification near the gallbladder neck.  No signs of acute cholecystitis.  Will order ultrasound to better characterize.  [PC]  [id number] Patient care turned over to oncoming provider. Patient case and results discussed in detail; please see their note for further ED managment.     [PC]    Clinical Course User Index [PC] Aj Crunkleton, Raynell Moder, MD    Final Clinical Impression(s) / ED Diagnoses Final diagnoses:  None    This chart was dictated using voice recognition software.  Despite best efforts to proofread,  errors can occur which can change the documentation meaning.    Trine Raynell Moder, MD 09/03/24 [phone]

## 2024-09-08 ENCOUNTER — Ambulatory Visit: Payer: Self-pay

## 2024-09-08 ENCOUNTER — Telehealth: Admitting: Family Medicine

## 2024-09-08 NOTE — Progress Notes (Signed)
 Pt did not show up for visit-DWB

## 2024-09-08 NOTE — Telephone Encounter (Signed)
 FYI Only or Action Required?: FYI only for provider: Appointment made for virtual UC.  Patient was last seen in primary care on unknown.  Called Nurse Triage reporting Nausea.  Symptoms began a week ago.  Interventions attempted: Nothing.  Symptoms are: gradually worsening.  Triage Disposition: See PCP When Office is Open (Within 3 Days)  Patient/caregiver understands and will follow disposition?: Yes    Summary: Nausea   Reason for Triage: Nauseated     Reason for Disposition  Nausea lasts > 1 week  Answer Assessment - Initial Assessment Questions 1. NAUSEA SEVERITY: How bad is the nausea? (e.g., mild, moderate, severe; dehydration, weight loss)     Moderate  2. ONSET: When did the nausea begin?     Monday afternoon  3. VOMITING: Any vomiting? If Yes, ask: How many times today?     No  4. RECURRENT SYMPTOM: Have you had nausea before? If Yes, ask: When was the last time? What happened that time?     No 5. CAUSE: What do you think is causing the nausea?     Has to have gallbladder taken out    This RN assisted with pt being seen for symptoms through the virtual UC. Appointment made for today.  Protocols used: Nausea-A-AH

## 2024-09-12 ENCOUNTER — Other Ambulatory Visit: Payer: Self-pay | Admitting: Surgery

## 2024-09-12 DIAGNOSIS — K8020 Calculus of gallbladder without cholecystitis without obstruction: Secondary | ICD-10-CM | POA: Diagnosis not present

## 2024-09-13 DIAGNOSIS — Z419 Encounter for procedure for purposes other than remedying health state, unspecified: Secondary | ICD-10-CM | POA: Diagnosis not present

## 2024-11-10 NOTE — Progress Notes (Signed)
 Date of COVID positive in last 90 days:  PCP - Emery Fuss, MD Cardiologist -  Neurology- Eduard Hanlon, MD  Chest x-ray - N/A EKG - N/A Stress Test - N/A ECHO - N/A Cardiac Cath - N/A Pacemaker/ICD device last checked:N/A Spinal Cord Stimulator:N/A  Bowel Prep - N/A  Sleep Study - N/A CPAP -   Fasting Blood Sugar - N/A Checks Blood Sugar _____ times a day  Last dose of GLP1 agonist-  N/A GLP1 instructions:  Do not take after     Last dose of SGLT-2 inhibitors-  N/A SGLT-2 instructions:  Do not take after     Blood Thinner Instructions: N/A Last dose:   Time: Aspirin Instructions:N/A Last Dose:  Activity level:  Can go up a flight of stairs and perform activities of daily living without stopping and without symptoms of chest pain or shortness of breath.  Able to exercise without symptoms  Unable to go up a flight of stairs without symptoms of     Anesthesia review: N/A  Patient denies shortness of breath, fever, cough and chest pain at PAT appointment  Patient verbalized understanding of instructions that were given to them at the PAT appointment. Patient was also instructed that they will need to review over the PAT instructions again at home before surgery.

## 2024-11-10 NOTE — Patient Instructions (Signed)
 SURGICAL WAITING ROOM VISITATION  Patients having surgery or a procedure may have no more than 2 support people in the waiting area - these visitors may rotate.    Children ages 81 and under will not be able to visit patients in [facility] under most circumstances.   Visitors with respiratory illnesses are discouraged from visiting and should remain at home.  If the patient needs to stay at the hospital during part of their recovery, the visitor guidelines for inpatient rooms apply. Pre-op nurse will coordinate an appropriate time for 1 support person to accompany patient in pre-op.  This support person may not rotate.    Please refer to the [facility] website for the visitor guidelines for Inpatients (after your surgery is over and you are in a regular room).    Your procedure is scheduled on: 11/15/24   Report to [facility] Main Entrance    Report to admitting at 6:15 AM   Call this number if you have problems the morning of surgery [phone]   Do not eat food :After Midnight.   After Midnight you may have the following liquids until 5:30 AM DAY OF SURGERY  Water Non-Citrus Juices (without pulp, NO RED-Apple, White grape, White cranberry) Black Coffee (NO MILK/CREAM OR CREAMERS, sugar ok)  Clear Tea (NO MILK/CREAM OR CREAMERS, sugar ok) regular and decaf                             Plain Jell-O (NO RED)                                           Fruit ices (not with fruit pulp, NO RED)                                     Popsicles (NO RED)                                                               Sports drinks like Gatorade (NO RED)     The day of surgery:  Drink ONE (1) Pre-Surgery Clear Ensure at 5:30 AM the morning of surgery. Drink in one sitting. Do not sip.  This drink was given to you during your hospital  pre-op appointment visit. Nothing else to drink after completing the  Pre-Surgery Clear Ensure.          If you have questions, please  contact your surgeons office.   FOLLOW BOWEL PREP AND ANY ADDITIONAL PRE OP INSTRUCTIONS YOU RECEIVED FROM YOUR SURGEON'S OFFICE!!!     Oral Hygiene is also important to reduce your risk of infection.                                    Remember - BRUSH YOUR TEETH THE MORNING OF SURGERY WITH YOUR REGULAR TOOTHPASTE  DENTURES WILL BE REMOVED PRIOR TO SURGERY PLEASE DO NOT APPLY Poly grip OR ADHESIVES!!!   Do NOT smoke after Midnight  Stop all vitamins and herbal supplements 7 days before surgery.   Take these medicines the morning of surgery with A SIP OF WATER: Famotidine , Totipalmate   DO NOT TAKE ANY ORAL DIABETIC MEDICATIONS DAY OF YOUR SURGERY  Bring CPAP mask and tubing day of surgery.                              You may not have any metal on your body including hair pins, jewelry, and body piercing             Do not wear make-up, lotions, powders, perfumes, or deodorant  Do not wear nail polish including gel and S&S, artificial/acrylic nails, or any other type of covering on natural nails including finger and toenails. If you have artificial nails, gel coating, etc. that needs to be removed by a nail salon please have this removed prior to surgery or surgery may need to be canceled/ delayed if the surgeon/ anesthesia feels like they are unable to be safely monitored.   Do not shave  48 hours prior to surgery.    Do not bring valuables to the hospital. [place] IS NOT             RESPONSIBLE   FOR VALUABLES.   Contacts, glasses, dentures or bridgework may not be worn into surgery.   DO NOT BRING YOUR HOME MEDICATIONS TO THE HOSPITAL. PHARMACY WILL DISPENSE MEDICATIONS LISTED ON YOUR MEDICATION LIST TO YOU DURING YOUR ADMISSION IN THE HOSPITAL!    Patients discharged on the day of surgery will not be allowed to drive home.  Someone NEEDS to stay with you for the first 24 hours after anesthesia.   Special Instructions: Bring a copy of your healthcare power of  attorney and living will documents the day of surgery if you haven't scanned them before.              Please read over the following fact sheets you were given: IF YOU HAVE QUESTIONS ABOUT YOUR PRE-OP INSTRUCTIONS PLEASE CALL [phone] [place].   If you received a COVID test during your pre-op visit  it is requested that you wear a mask when out in public, stay away from anyone that may not be feeling well and notify your surgeon if you develop symptoms. If you test positive for Covid or have been in contact with anyone that has tested positive in the last 10 days please notify you surgeon.    [place] - Preparing for Surgery Before surgery, you can play an important role.  Because skin is not sterile, your skin needs to be as free of germs as possible.  You can reduce the number of germs on your skin by washing with CHG (chlorahexidine gluconate) soap before surgery.  CHG is an antiseptic cleaner which kills germs and bonds with the skin to continue killing germs even after washing. Please DO NOT use if you have an allergy to CHG or antibacterial soaps.  If your skin becomes reddened/irritated stop using the CHG and inform your nurse when you arrive at Short Stay. Do not shave (including legs and underarms) for at least 48 hours prior to the first CHG shower.  You may shave your face/neck.  Please follow these instructions carefully:  1.  Shower with CHG Soap the night before surgery ONLY (DO NOT USE THE SOAP THE MORNING OF SURGERY).  2.  If you choose to wash your hair,  wash your hair first as usual with your normal  shampoo.  3.  After you shampoo, rinse your hair and body thoroughly to remove the shampoo.                             4.  Use CHG as you would any other liquid soap.  You can apply chg directly to the skin and wash.  Gently with a scrungie or clean washcloth.  5.  Apply the CHG Soap to your body ONLY FROM THE NECK DOWN.   Do   not use on face/ open                            Wound or open sores. Avoid contact with eyes, ears mouth and   genitals (private parts).                       Wash face,  Genitals (private parts) with your normal soap.             6.  Wash thoroughly, paying special attention to the area where your    surgery  will be performed.  7.  Thoroughly rinse your body with warm water from the neck down.  8.  DO NOT shower/wash with your normal soap after using and rinsing off the CHG Soap.                9.  Pat yourself dry with a clean towel.            10.  Wear clean pajamas.            11.  Place clean sheets on your bed the night of your first shower and do not  sleep with pets. Day of Surgery : Do not apply any CHG, lotions/deodorants the morning of surgery.  Please wear clean clothes to the hospital/surgery center.  FAILURE TO FOLLOW THESE INSTRUCTIONS MAY RESULT IN THE CANCELLATION OF YOUR SURGERY  PATIENT SIGNATURE_________________________________  NURSE SIGNATURE__________________________________  ________________________________________________________________________

## 2024-11-13 ENCOUNTER — Encounter (HOSPITAL_COMMUNITY)
Admission: RE | Admit: 2024-11-13 | Discharge: 2024-11-13 | Disposition: A | Discharge status: Home / Self Care | Source: Ambulatory Visit | Attending: Anesthesiology | Admitting: Anesthesiology

## 2024-11-13 DIAGNOSIS — G988 Other disorders of nervous system: Secondary | ICD-10-CM

## 2024-11-13 NOTE — Progress Notes (Signed)
 Called patient regarding her PST appointment at 0800. She stated she called surgeon's office 11/10/24 about needing to postpone her surgery. Pt states she has not heard back from yet.

## 2024-11-27 ENCOUNTER — Encounter (HOSPITAL_COMMUNITY)
Admission: RE | Admit: 2024-11-27 | Discharge: 2024-11-27 | Disposition: A | Discharge status: Home / Self Care | Source: Ambulatory Visit | Attending: Orthopaedic Surgery | Admitting: Orthopaedic Surgery

## 2024-11-27 ENCOUNTER — Other Ambulatory Visit: Payer: Self-pay

## 2024-11-27 ENCOUNTER — Encounter (HOSPITAL_COMMUNITY): Payer: Self-pay

## 2024-11-27 VITALS — Ht 65.0 in | Wt 275.0 lb

## 2024-11-27 DIAGNOSIS — Z01818 Encounter for other preprocedural examination: Secondary | ICD-10-CM

## 2024-11-27 NOTE — Progress Notes (Addendum)
 Anesthesia Review:  PCP: Cardiologist : none   PPM/ ICD: Device Orders: Rep Notified:  Chest x-ray : EKG : Echo : Stress test: Cardiac Cath :   Activity level:  can climb a flight of stairs without difficulty  Sleep Study/ CPAP : none  Fasting Blood Sugar :      / Checks Blood Sugar -- times a day:    Blood Thinner/ Instructions /Last Dose: ASA / Instructions/ Last Dose :    MEd hx and preop phone call completed via phone call on 11/27/24.  AT end of phone call pt asked to call Admitting at [phone].  PT voiced understanding.

## 2024-11-27 NOTE — Patient Instructions (Signed)
 SURGICAL WAITING ROOM VISITATION  Patients having surgery or a procedure may have no more than 2 support people in the waiting area - these visitors may rotate.    Children ages 28 and under will not be able to visit patients in [facility] under most circumstances.   Visitors with respiratory illnesses are discouraged from visiting and should remain at home.  If the patient needs to stay at the hospital during part of their recovery, the visitor guidelines for inpatient rooms apply. Pre-op nurse will coordinate an appropriate time for 1 support person to accompany patient in pre-op.  This support person may not rotate.    Please refer to the [facility] website for the visitor guidelines for Inpatients (after your surgery is over and you are in a regular room).       Your procedure is scheduled on:   11/30/24    Report to [facility] Main Entrance    Report to admitting at  0800 AM   Call this number if you have problems the morning of surgery [phone]   Do not eat food :After Midnight.   After Midnight you may have the following liquids until ___  0700___ AM  DAY OF SURGERY  Water  Non-Citrus Juices (without pulp, NO RED-Apple, White grape, White cranberry) Black Coffee (NO MILK/CREAM OR CREAMERS, sugar ok)  Clear Tea (NO MILK/CREAM OR CREAMERS, sugar ok) regular and decaf                             Plain Jell-O (NO RED)                                           Fruit ices (not with fruit pulp, NO RED)                                     Popsicles (NO RED)                                                               Sports drinks like Gatorade (NO RED)                   The day of surgery:  Drink ONE (1) Pre-Surgery Clear Ensure or G2 at  0800 AM the morning of surgery. Drink in one sitting. Do not sip.  This drink was given to you during your hospital  pre-op appointment visit. Nothing else to drink after completing the  Pre-Surgery Clear Ensure or  G2.          If you have questions, please contact your surgeons office.       Oral Hygiene is also important to reduce your risk of infection.                                    Remember - BRUSH YOUR TEETH THE MORNING OF SURGERY WITH YOUR REGULAR TOOTHPASTE  DENTURES WILL BE REMOVED PRIOR TO SURGERY PLEASE DO  NOT APPLY Poly grip OR ADHESIVES!!!   Do NOT smoke after Midnight   Stop all vitamins and herbal supplements 7 days before surgery.   Take these medicines the morning of surgery with A SIP OF WATER :  pepcid , topamax    DO NOT TAKE ANY ORAL DIABETIC MEDICATIONS DAY OF YOUR SURGERY  Bring CPAP mask and tubing day of surgery.                              You may not have any metal on your body including hair pins, jewelry, and body piercing             Do not wear make-up, lotions, powders, perfumes/cologne, or deodorant  Do not wear nail polish including gel and S&S, artificial/acrylic nails, or any other type of covering on natural nails including finger and toenails. If you have artificial nails, gel coating, etc. that needs to be removed by a nail salon please have this removed prior to surgery or surgery may need to be canceled/ delayed if the surgeon/ anesthesia feels like they are unable to be safely monitored.   Do not shave  48 hours prior to surgery.               Men may shave face and neck.   Do not bring valuables to the hospital. [place] IS NOT             RESPONSIBLE   FOR VALUABLES.   Contacts, glasses, dentures or bridgework may not be worn into surgery.   Bring small overnight bag day of surgery.   DO NOT BRING YOUR HOME MEDICATIONS TO THE HOSPITAL. PHARMACY WILL DISPENSE MEDICATIONS LISTED ON YOUR MEDICATION LIST TO YOU DURING YOUR ADMISSION IN THE HOSPITAL!    Patients discharged on the day of surgery will not be allowed to drive home.  Someone NEEDS to stay with you for the first 24 hours after anesthesia.   Special Instructions: Bring a copy  of your healthcare power of attorney and living will documents the day of surgery if you haven't scanned them before.              Please read over the following fact sheets you were given: IF YOU HAVE QUESTIONS ABOUT YOUR PRE-OP INSTRUCTIONS PLEASE CALL [phone].   If you received a COVID test during your pre-op visit  it is requested that you wear a mask when out in public, stay away from anyone that may not be feeling well and notify your surgeon if you develop symptoms. If you test positive for Covid or have been in contact with anyone that has tested positive in the last 10 days please notify you surgeon.    [place] - Preparing for Surgery Before surgery, you can play an important role.  Because skin is not sterile, your skin needs to be as free of germs as possible.  You can reduce the number of germs on your skin by washing with CHG (chlorahexidine gluconate) soap before surgery.  CHG is an antiseptic cleaner which kills germs and bonds with the skin to continue killing germs even after washing. Please DO NOT use if you have an allergy to CHG or antibacterial soaps.  If your skin becomes reddened/irritated stop using the CHG and inform your nurse when you arrive at Short Stay. Do not shave (including legs and underarms) for at least 48 hours prior to the first CHG shower.  You may shave your face/neck.  Please follow these instructions carefully:  1.  Shower with CHG Soap the night before surgery ONLY (DO NOT USE THE SOAP THE MORNING OF SURGERY).  2.  If you choose to wash your hair, wash your hair first as usual with your normal  shampoo.  3.  After you shampoo, rinse your hair and body thoroughly to remove the shampoo.                             4.  Use CHG as you would any other liquid soap.  You can apply chg directly to the skin and wash.  Gently with a scrungie or clean washcloth.  5.  Apply the CHG Soap to your body ONLY FROM THE NECK DOWN.   Do   not use on face/ open                            Wound or open sores. Avoid contact with eyes, ears mouth and   genitals (private parts).                       Wash face,  Genitals (private parts) with your normal soap.             6.  Wash thoroughly, paying special attention to the area where your    surgery  will be performed.  7.  Thoroughly rinse your body with warm water  from the neck down.  8.  DO NOT shower/wash with your normal soap after using and rinsing off the CHG Soap.                9.  Pat yourself dry with a clean towel.            10.  Wear clean pajamas.            11.  Place clean sheets on your bed the night of your first shower and do not  sleep with pets. Day of Surgery : Do not apply any CHG, lotions/deodorants the morning of surgery.  Please wear clean clothes to the hospital/surgery center.  FAILURE TO FOLLOW THESE INSTRUCTIONS MAY RESULT IN THE CANCELLATION OF YOUR SURGERY  PATIENT SIGNATURE_________________________________  NURSE SIGNATURE__________________________________  ________________________________________________________________________

## 2024-11-29 NOTE — H&P (Signed)
 " REFERRING PHYSICIAN: [facility] PROVIDER: VICENTA DASIE POLI, MD MRN: [id number] DOB: 11/05/1995  Subjective   Chief Complaint: New Consultation ( GALLBLADDER)  History of Present Illness: Kimberly Krause is a 29 y.o. female who is seen as an office consultation for evaluation of New Consultation ( GALLBLADDER)  This is a 29 year old female referred here for possible symptomatic gallstones. She has had no previous abdominal issues until mid October when she started having right upper quadrant abdominal pain and pain in her back. She was seen in the emergency department October 19 and then again on November 2. She did have an episode of nausea and vomiting and a low-grade temperature. An ultrasound was performed showing a 6 mm gallstone and several 2 to 3 mm gallbladder polyps. The common bile duct was only 2 mm. She reports she has had 1 episode of dark urine as well. She is now on a low-fat diet and managing her discomfort with Tylenol  and ibuprofen.  Review of Systems: A complete review of systems was obtained from the patient. I have reviewed this information and discussed as appropriate with the patient. See HPI as well for other ROS.  ROS   Medical History: Past Medical History:  Diagnosis Date  Pseudotumor cerebri 2013   There is no problem list on file for this patient.  Past Surgical History:  Procedure Laterality Date  Carpal tunnel release (Left) 2023    Allergies  Allergen Reactions  Sulfa (Sulfonamide Antibiotics) Other (See Comments), Rash and Hives   Current Outpatient Medications on File Prior to Visit  Medication Sig Dispense Refill  famotidine  (PEPCID ) 20 MG tablet Take 20 mg by mouth 2 (two) times daily  rizatriptan  (MAXALT -MLT) 10 MG disintegrating tablet Take 10 mg by mouth as needed for Migraine May repeat in 2 hours  topiramate  (TOPAMAX ) 50 MG tablet Take 50 mg by mouth 2 (two) times daily  semaglutide (WEGOVY) 0.25 mg/0.5 mL pen injector INJECT  0.25MG  UNDER THE SKIN ONCE WEEKLY FOR WEEKS 1-4 OF THERAPY.   No current facility-administered medications on file prior to visit.   Family History  Problem Relation Age of Onset  High blood pressure (Hypertension) Mother  Diabetes Father  High blood pressure (Hypertension) Father  Pseudotumor cerebri Neg Hx    Social History   Tobacco Use  Smoking Status Every Day  Types: Cigarettes  Smokeless Tobacco Never    Social History   Socioeconomic History  Marital status: Single  Tobacco Use  Smoking status: Every Day  Types: Cigarettes  Smokeless tobacco: Never  Vaping Use  Vaping status: Unknown  Substance and Sexual Activity  Alcohol use: Never  Drug use: Never   Social Drivers of Corporate Investment Banker Strain: Low Risk (06/25/2019)  Received from [facility]  Overall Financial Resource Strain (CARDIA)  Difficulty of Paying Living Expenses: Not hard at all  Food Insecurity: No Food Insecurity (06/25/2019)  Received from [facility]  Hunger Vital Sign  Within the past 12 months, you worried that your food would run out before you got the money to buy more.: Never true  Within the past 12 months, the food you bought just didn't last and you didn't have money to get more.: Never true  Transportation Needs: No Transportation Needs (06/25/2019)  Received from [facility]  PRAPARE - Transportation  Lack of Transportation (Medical): No  Lack of Transportation (Non-Medical): No  Physical Activity: Inactive (06/25/2019)  Received from [facility]  Exercise Vital Sign  On average, how many  days per week do you engage in moderate to strenuous exercise (like a brisk walk)?: 0 days  On average, how many minutes do you engage in exercise at this level?: 0 min  Stress: No Stress Concern Present (06/25/2019)  Received from [facility] - Occupational Stress Questionnaire  Feeling of Stress : Not at all  Housing Stability:  Unknown (05/29/2024)  Housing Stability Vital Sign  Homeless in the Last Year: No   Objective:   Vitals:   Pulse: 98  Resp: 16  Temp: 36.8 C (98.2 F)  SpO2: 98%  Weight: (!) 122.5 kg (270 lb)  Height: 165.1 cm (5' 5)  PainSc: 4   Body mass index is 44.93 kg/m.  Physical Exam   She appears well on exam  Her abdomen is soft and obese. There is some tenderness to deep palpation in the right upper quadrant  Labs, Imaging and Diagnostic Testing: I reviewed her ultrasound, CT scan, and laboratory data  Assessment and Plan:   Diagnoses and all orders for this visit:  Symptomatic cholelithiasis   I discussed gallstones with the patient. This does run in her family with multiple family members having had symptomatic gallstones and gallbladder surgery. I gave her literature regarding surgery. We discussed proceeding with laparoscopic cholecystectomy and cholangiogram. I explained the surgical procedure in detail. We discussed the risk which includes but is not limited to bleeding, infection, bile duct injury, bile leak, the need to convert to an open procedure, cardiopulmonary issues with anesthesia, the need for other procedures, the chance this may not resolve all her discomfort, stop recovery, excetra. She understands and wished to proceed with surgery which will be scheduled  "

## 2024-11-30 ENCOUNTER — Encounter (HOSPITAL_COMMUNITY): Payer: Self-pay | Admitting: Surgery

## 2024-11-30 ENCOUNTER — Ambulatory Visit (HOSPITAL_COMMUNITY): Admitting: Anesthesiology

## 2024-11-30 ENCOUNTER — Ambulatory Visit (HOSPITAL_COMMUNITY)
Admission: RE | Admit: 2024-11-30 | Discharge: 2024-11-30 | Disposition: A | Discharge status: Home / Self Care | Attending: Surgery | Admitting: Surgery

## 2024-11-30 ENCOUNTER — Other Ambulatory Visit: Payer: Self-pay

## 2024-11-30 ENCOUNTER — Encounter (HOSPITAL_COMMUNITY)
Admission: RE | Disposition: A | Discharge status: Home / Self Care | Payer: Self-pay | Source: Home / Self Care | Attending: Surgery

## 2024-11-30 ENCOUNTER — Encounter (HOSPITAL_COMMUNITY): Payer: Self-pay | Admitting: Medical

## 2024-11-30 DIAGNOSIS — Z01818 Encounter for other preprocedural examination: Secondary | ICD-10-CM

## 2024-11-30 DIAGNOSIS — K8010 Calculus of gallbladder with chronic cholecystitis without obstruction: Secondary | ICD-10-CM | POA: Insufficient documentation

## 2024-11-30 DIAGNOSIS — F17210 Nicotine dependence, cigarettes, uncomplicated: Secondary | ICD-10-CM | POA: Diagnosis not present

## 2024-11-30 DIAGNOSIS — K8020 Calculus of gallbladder without cholecystitis without obstruction: Secondary | ICD-10-CM | POA: Diagnosis not present

## 2024-11-30 DIAGNOSIS — G988 Other disorders of nervous system: Secondary | ICD-10-CM

## 2024-11-30 LAB — CBC
HCT: 43.8 % (ref 36.0–46.0)
Hemoglobin: 14.3 g/dL (ref 12.0–15.0)
MCH: 28.9 pg (ref 26.0–34.0)
MCHC: 32.6 g/dL (ref 30.0–36.0)
MCV: 88.5 fL (ref 80.0–100.0)
Platelets: 250 10*3/uL (ref 150–400)
RBC: 4.95 MIL/uL (ref 3.87–5.11)
RDW: 13.4 % (ref 11.5–15.5)
WBC: 10.5 10*3/uL (ref 4.0–10.5)
nRBC: 0 % (ref 0.0–0.2)

## 2024-11-30 LAB — POCT PREGNANCY, URINE: Preg Test, Ur: NEGATIVE

## 2024-11-30 MED ORDER — DEXAMETHASONE SOD PHOSPHATE PF 10 MG/ML IJ SOLN
INTRAMUSCULAR | Status: AC
  Filled 2024-11-30: qty 1

## 2024-11-30 MED ORDER — ROCURONIUM BROMIDE 10 MG/ML (PF) SYRINGE
PREFILLED_SYRINGE | INTRAVENOUS | Status: DC | PRN
  Administered 2024-11-30: 20 mg via INTRAVENOUS
  Administered 2024-11-30: 70 mg via INTRAVENOUS

## 2024-11-30 MED ORDER — ONDANSETRON HCL 4 MG/2ML IJ SOLN
INTRAMUSCULAR | Status: DC | PRN
  Administered 2024-11-30: 4 mg via INTRAVENOUS

## 2024-11-30 MED ORDER — SUGAMMADEX SODIUM 200 MG/2ML IV SOLN
INTRAVENOUS | Status: DC | PRN
  Administered 2024-11-30: 200 mg via INTRAVENOUS

## 2024-11-30 MED ORDER — PHENYLEPHRINE 80 MCG/ML (10ML) SYRINGE FOR IV PUSH (FOR BLOOD PRESSURE SUPPORT)
PREFILLED_SYRINGE | INTRAVENOUS | Status: AC
  Filled 2024-11-30: qty 10

## 2024-11-30 MED ORDER — KETOROLAC TROMETHAMINE 30 MG/ML IJ SOLN
INTRAMUSCULAR | Status: DC | PRN
  Administered 2024-11-30: 30 mg via INTRAVENOUS

## 2024-11-30 MED ORDER — PROPOFOL 10 MG/ML IV BOLUS
INTRAVENOUS | Status: DC | PRN
  Administered 2024-11-30: 200 mg via INTRAVENOUS

## 2024-11-30 MED ORDER — STERILE WATER FOR IRRIGATION IR SOLN
Status: DC | PRN
  Administered 2024-11-30: 1000 mL

## 2024-11-30 MED ORDER — SUGAMMADEX SODIUM 200 MG/2ML IV SOLN
INTRAVENOUS | Status: AC
  Filled 2024-11-30: qty 2

## 2024-11-30 MED ORDER — CHLORHEXIDINE GLUCONATE 0.12 % MT SOLN: 15.0000 mL | Freq: Once | OROMUCOSAL | Status: DC

## 2024-11-30 MED ORDER — HYDROMORPHONE HCL 1 MG/ML IJ SOLN
INTRAMUSCULAR | Status: DC | PRN
  Administered 2024-11-30 (×2): .7 mg via INTRAVENOUS
  Administered 2024-11-30: .6 mg via INTRAVENOUS

## 2024-11-30 MED ORDER — FENTANYL CITRATE (PF) 50 MCG/ML IJ SOSY
25.0000 ug | PREFILLED_SYRINGE | INTRAMUSCULAR | Status: DC | PRN
  Administered 2024-11-30 (×3): 50 ug via INTRAVENOUS

## 2024-11-30 MED ORDER — ENSURE PRE-SURGERY PO LIQD: 296.0000 mL | Freq: Once | ORAL | Status: DC

## 2024-11-30 MED ORDER — 0.9 % SODIUM CHLORIDE (POUR BTL) OPTIME
TOPICAL | Status: DC | PRN
  Administered 2024-11-30: 1000 mL

## 2024-11-30 MED ORDER — FENTANYL CITRATE (PF) 100 MCG/2ML IJ SOLN
INTRAMUSCULAR | Status: AC
  Filled 2024-11-30: qty 2

## 2024-11-30 MED ORDER — OXYCODONE HCL 5 MG PO TABS: 5.0000 mg | ORAL_TABLET | Freq: Four times a day (QID) | ORAL | 0 refills | Status: AC | PRN

## 2024-11-30 MED ORDER — OXYCODONE HCL 5 MG PO TABS
5.0000 mg | ORAL_TABLET | Freq: Once | ORAL | Status: AC | PRN
  Administered 2024-11-30: 5 mg via ORAL

## 2024-11-30 MED ORDER — CHLORHEXIDINE GLUCONATE CLOTH 2 % EX PADS: 6.0000 | MEDICATED_PAD | Freq: Once | CUTANEOUS | Status: DC

## 2024-11-30 MED ORDER — CEFAZOLIN SODIUM-DEXTROSE 2-4 GM/100ML-% IV SOLN
INTRAVENOUS | Status: AC
  Filled 2024-11-30: qty 100

## 2024-11-30 MED ORDER — OXYCODONE HCL 5 MG/5ML PO SOLN: 5.0000 mg | Freq: Once | ORAL | Status: AC | PRN

## 2024-11-30 MED ORDER — PROPOFOL 10 MG/ML IV BOLUS
INTRAVENOUS | Status: AC
  Filled 2024-11-30: qty 20

## 2024-11-30 MED ORDER — FENTANYL CITRATE (PF) 50 MCG/ML IJ SOSY
PREFILLED_SYRINGE | INTRAMUSCULAR | Status: AC
  Filled 2024-11-30: qty 1

## 2024-11-30 MED ORDER — DROPERIDOL 2.5 MG/ML IJ SOLN: 0.6250 mg | Freq: Once | INTRAMUSCULAR | Status: DC | PRN

## 2024-11-30 MED ORDER — BUPIVACAINE HCL (PF) 0.5 % IJ SOLN
INTRAMUSCULAR | Status: AC
  Filled 2024-11-30: qty 30

## 2024-11-30 MED ORDER — BUPIVACAINE HCL (PF) 0.5 % IJ SOLN
INTRAMUSCULAR | Status: DC | PRN
  Administered 2024-11-30: 20 mL

## 2024-11-30 MED ORDER — ORAL CARE MOUTH RINSE: 15.0000 mL | Freq: Once | OROMUCOSAL | Status: AC

## 2024-11-30 MED ORDER — ACETAMINOPHEN 500 MG PO TABS
1000.0000 mg | ORAL_TABLET | ORAL | Status: AC
  Administered 2024-11-30: 1000 mg via ORAL

## 2024-11-30 MED ORDER — ROCURONIUM BROMIDE 10 MG/ML (PF) SYRINGE
PREFILLED_SYRINGE | INTRAVENOUS | Status: AC
  Filled 2024-11-30: qty 10

## 2024-11-30 MED ORDER — FENTANYL CITRATE (PF) 250 MCG/5ML IJ SOLN
INTRAMUSCULAR | Status: DC | PRN
  Administered 2024-11-30 (×4): 50 ug via INTRAVENOUS

## 2024-11-30 MED ORDER — MIDAZOLAM HCL 2 MG/2ML IJ SOLN
INTRAMUSCULAR | Status: AC
  Filled 2024-11-30: qty 2

## 2024-11-30 MED ORDER — LACTATED RINGERS IR SOLN
Status: DC | PRN
  Administered 2024-11-30: 1000 mL

## 2024-11-30 MED ORDER — ORAL CARE MOUTH RINSE: 15.0000 mL | Freq: Once | OROMUCOSAL | Status: DC

## 2024-11-30 MED ORDER — CEFAZOLIN SODIUM-DEXTROSE 2-4 GM/100ML-% IV SOLN
2.0000 g | INTRAVENOUS | Status: AC
  Administered 2024-11-30: 2 g via INTRAVENOUS

## 2024-11-30 MED ORDER — DEXAMETHASONE SOD PHOSPHATE PF 10 MG/ML IJ SOLN
INTRAMUSCULAR | Status: DC | PRN
  Administered 2024-11-30: 10 mg via INTRAVENOUS

## 2024-11-30 MED ORDER — ACETAMINOPHEN 500 MG PO TABS
ORAL_TABLET | ORAL | Status: AC
  Filled 2024-11-30: qty 2

## 2024-11-30 MED ORDER — LACTATED RINGERS IV SOLN: INTRAVENOUS | Status: DC

## 2024-11-30 MED ORDER — ONDANSETRON HCL 4 MG/2ML IJ SOLN
INTRAMUSCULAR | Status: AC
  Filled 2024-11-30: qty 4

## 2024-11-30 MED ORDER — FENTANYL CITRATE (PF) 50 MCG/ML IJ SOSY
PREFILLED_SYRINGE | INTRAMUSCULAR | Status: AC
  Filled 2024-11-30: qty 2

## 2024-11-30 MED ORDER — LIDOCAINE 2% (20 MG/ML) 5 ML SYRINGE
INTRAMUSCULAR | Status: DC | PRN
  Administered 2024-11-30: 60 mg via INTRAVENOUS

## 2024-11-30 MED ORDER — OXYCODONE HCL 5 MG PO TABS
ORAL_TABLET | ORAL | Status: AC
  Filled 2024-11-30: qty 1

## 2024-11-30 MED ORDER — LIDOCAINE HCL (PF) 2 % IJ SOLN
INTRAMUSCULAR | Status: AC
  Filled 2024-11-30: qty 5

## 2024-11-30 MED ORDER — HYDROMORPHONE HCL 2 MG/ML IJ SOLN
INTRAMUSCULAR | Status: AC
  Filled 2024-11-30: qty 1

## 2024-11-30 MED ORDER — MIDAZOLAM HCL (PF) 2 MG/2ML IJ SOLN
INTRAMUSCULAR | Status: DC | PRN
  Administered 2024-11-30: 2 mg via INTRAVENOUS

## 2024-11-30 MED ORDER — CHLORHEXIDINE GLUCONATE 0.12 % MT SOLN
15.0000 mL | Freq: Once | OROMUCOSAL | Status: AC
  Administered 2024-11-30: 15 mL via OROMUCOSAL

## 2024-11-30 NOTE — Anesthesia Procedure Notes (Signed)
 Procedure Name: Intubation Date/Time: 11/30/2024 11:08 AM  Performed by: Cena Epps, CRNAPre-anesthesia Checklist: Patient identified, Emergency Drugs available, Suction available and Patient being monitored Patient Re-evaluated:Patient Re-evaluated prior to induction Oxygen Delivery Method: Circle System Utilized Preoxygenation: Pre-oxygenation with 100% oxygen Induction Type: IV induction Ventilation: Mask ventilation without difficulty Laryngoscope Size: Mac and 3 Grade View: Grade I Tube type: Oral Tube size: 7.0 mm Number of attempts: 1 Airway Equipment and Method: Stylet and Oral airway Placement Confirmation: ETT inserted through vocal cords under direct vision, positive ETCO2 and breath sounds checked- equal and bilateral Secured at: 22 cm Tube secured with: Tape Dental Injury: Teeth and Oropharynx as per pre-operative assessment

## 2024-11-30 NOTE — Anesthesia Postprocedure Evaluation (Signed)
"   Anesthesia Post Note  Patient: Kimberly Krause  Procedure(s) Performed: LAPAROSCOPIC CHOLECYSTECTOMY (Abdomen)     Patient location during evaluation: PACU Anesthesia Type: General Level of consciousness: awake and alert Pain management: pain level controlled Vital Signs Assessment: post-procedure vital signs reviewed and stable Respiratory status: spontaneous breathing, nonlabored ventilation, respiratory function stable and patient connected to nasal cannula oxygen Cardiovascular status: blood pressure returned to baseline and stable Postop Assessment: no apparent nausea or vomiting Anesthetic complications: no   No notable events documented.  Last Vitals:  Vitals:   11/30/24 1500 11/30/24 1514  BP: (!) 150/89 (!) 152/86  Pulse: 83 92  Resp:    Temp:    SpO2: 92% 96%    Last Pain:  Vitals:   11/30/24 1500  TempSrc:   PainSc: 4                  Epifanio Charleston E      "

## 2024-11-30 NOTE — Interval H&P Note (Signed)
 History and Physical Interval Note: no change in H and P  11/30/2024 10:44 AM  Kimberly Krause  has presented today for surgery, with the diagnosis of SYMPTOMATIC GALLSTONES.  The various methods of treatment have been discussed with the patient and family. After consideration of risks, benefits and other options for treatment, the patient has consented to  Procedures: LAPAROSCOPIC CHOLECYSTECTOMY WITH INTRAOPERATIVE CHOLANGIOGRAM (N/A) as a surgical intervention.  The patient's history has been reviewed, patient examined, no change in status, stable for surgery.  I have reviewed the patient's chart and labs.  Questions were answered to the patient's satisfaction.     Vicenta Poli

## 2024-11-30 NOTE — Transfer of Care (Signed)
 Immediate Anesthesia Transfer of Care Note  Patient: Kimberly Krause  Procedure(s) Performed: LAPAROSCOPIC CHOLECYSTECTOMY (Abdomen)  Patient Location: PACU  Anesthesia Type:General  Level of Consciousness: awake, alert , and oriented  Airway & Oxygen Therapy: Patient Spontanous Breathing and Patient connected to face mask oxygen  Post-op Assessment: Report given to RN and Post -op Vital signs reviewed and unstable, Anesthesiologist notified  Post vital signs: Reviewed and stable  Last Vitals:  Vitals Value Taken Time  BP 144/91 11/30/24 13:13  Temp    Pulse 79 11/30/24 13:14  Resp    SpO2 100 % 11/30/24 13:14  Vitals shown include unfiled device data.  Last Pain:  Vitals:   11/30/24 0847  TempSrc:   PainSc: 0-No pain         Complications: No notable events documented.

## 2024-11-30 NOTE — Discharge Instructions (Signed)
[facility] ______[facility] LAPAROSCOPIC SURGERY: POST OP INSTRUCTIONS Always review your discharge instruction sheet given to you by the facility where your surgery was performed. IF YOU HAVE DISABILITY OR FAMILY LEAVE FORMS, YOU MUST BRING THEM TO THE OFFICE FOR PROCESSING.   DO NOT GIVE THEM TO YOUR DOCTOR.  A prescription for pain medication may be given to you upon discharge.  Take your pain medication as prescribed, if needed.  If narcotic pain medicine is not needed, then you may take acetaminophen (Tylenol) or ibuprofen (Advil) as needed. Take your usually prescribed medications unless otherwise directed. If you need a refill on your pain medication, please contact your pharmacy.  They will contact our office to request authorization. Prescriptions will not be filled after 5pm or on week-ends. You should follow a light diet the first few days after arrival home, such as soup and crackers, etc.  Be sure to include lots of fluids daily. Most patients will experience some swelling and bruising in the area of the incisions.  Ice packs will help.  Swelling and bruising can take several days to resolve.  It is common to experience some constipation if taking pain medication after surgery.  Increasing fluid intake and taking a stool softener (such as Colace) will usually help or prevent this problem from occurring.  A mild laxative (Milk of Magnesia or Miralax) should be taken according to package instructions if there are no bowel movements after 48 hours. Unless discharge instructions indicate otherwise, you may remove your bandages 24-48 hours after surgery, and you may shower at that time.  You may have steri-strips (small skin tapes) in place directly over the incision.  These strips should be left on the skin for 7-10 days.  If your surgeon used skin glue on the incision, you may shower in 24 hours.  The glue will flake off over the next 2-3 weeks.  Any sutures or staples will be  removed at the office during your follow-up visit. ACTIVITIES:  You may resume regular (light) daily activities beginning the next day--such as daily self-care, walking, climbing stairs--gradually increasing activities as tolerated.  You may have sexual intercourse when it is comfortable.  Refrain from any heavy lifting or straining until approved by your doctor. You may drive when you are no longer taking prescription pain medication, you can comfortably wear a seatbelt, and you can safely maneuver your car and apply brakes. RETURN TO WORK:  __________________________________________________________ You should see your doctor in the office for a follow-up appointment approximately 2-3 weeks after your surgery.  Make sure that you call for this appointment within a day or two after you arrive home to insure a convenient appointment time. OTHER INSTRUCTIONS: YOU MAY SHOWER STARTING TOMORROW ICE PACK, TYLENOL, AND IBUPROFEN ALSO FOR PAIN NO LIFTING MORE THAN 15 POUNDS FOR 2 WEEKS __________________________________________________________________________________________________________________________ __________________________________________________________________________________________________________________________ WHEN TO CALL YOUR DOCTOR: Fever over 101.0 Inability to urinate Continued bleeding from incision. Increased pain, redness, or drainage from the incision. Increasing abdominal pain  The clinic staff is available to answer your questions during regular business hours.  Please don't hesitate to call and ask to speak to one of the nurses for clinical concerns.  If you have a medical emergency, go to the nearest emergency room or call 911.  A surgeon from [facility] is always on call at the hospital. [address], [address], [place], [place]  [address] ? [address] [address], [place], [place]   [address] [phone] ? [phone] ?   FAX [phone] Web site:  [email or web address] 

## 2024-11-30 NOTE — Op Note (Signed)
 Laparoscopic Cholecystectomy Procedure Note  Indications: This patient presents with symptomatic gallbladder disease and will undergo laparoscopic cholecystectomy.  Pre-operative Diagnosis: Symptomatic cholelithiasis  Post-operative Diagnosis: Same  Surgeon: Vicenta Poli MD  Assistants: Curtistine Curly, MD, [facility] Resident  Anesthesia: General endotracheal anesthesia  ASA Class: 2  Procedure Details  The patient was seen again in the Holding Room. The risks, benefits, complications, treatment options, and expected outcomes were discussed with the patient. The possibilities of reaction to medication, pulmonary aspiration, perforation of viscus, bleeding, recurrent infection, finding a normal gallbladder, the need for additional procedures, failure to diagnose a condition, the possible need to convert to an open procedure, and creating a complication requiring transfusion or operation were discussed with the patient. The likelihood of improving the patient's symptoms with return to their baseline status is good.  The patient and/or family concurred with the proposed plan, giving informed consent. The site of surgery properly noted. The patient was taken to Operating Room, identified as Kimberly Krause and the procedure verified as Laparoscopic Cholecystectomy with Intraoperative Cholangiogram. A Time Out was held and the above information confirmed.  Prior to the induction of general anesthesia, antibiotic prophylaxis was administered. General endotracheal anesthesia was then administered and tolerated well. After the induction, the abdomen was prepped with Chloraprep and draped in sterile fashion. The patient was positioned in the supine position.  Local anesthetic agent was injected into the skin near the umbilicus and an incision made. We dissected down to the abdominal fascia with blunt dissection.  The fascia was incised vertically and we entered the peritoneal cavity bluntly.  A pursestring  suture of 0-Vicryl was placed around the fascial opening.  The Hasson cannula was inserted and secured with the stay suture.  Pneumoperitoneum was then created with CO2 and tolerated well without any adverse changes in the patient's vital signs. A 5-mm port was placed in the subxiphoid position.  Two 5-mm ports were placed in the right upper quadrant. All skin incisions were infiltrated with a local anesthetic agent before making the incision and placing the trocars.   We positioned the patient in reverse Trendelenburg, tilted slightly to the patient's left.  The gallbladder was identified, the fundus grasped and retracted cephalad. Adhesions were lysed bluntly and with the electrocautery where indicated, taking care not to injure any adjacent organs or viscus. The infundibulum was grasped and retracted laterally, exposing the peritoneum overlying the triangle of Calot. This was then divided and exposed in a blunt fashion. The cystic duct was clearly identified and bluntly dissected circumferentially. A critical view of the cystic duct and cystic artery was obtained.  The cystic duct was then ligated with clips and divided. The cystic artery was, dissected free, ligated with clips and divided as well.    A small posterior branch of the cystic artery was also clipped proximally and then transected with the cautery.  The gallbladder was dissected from the liver bed in retrograde fashion with the electrocautery. The gallbladder was removed and placed in an Endocatch sac. The liver bed was irrigated and inspected. Hemostasis was achieved with the electrocautery. Copious irrigation was utilized and was repeatedly aspirated until clear.  The gallbladder and Endocatch sac were then removed through the umbilical port site.  The pursestring suture was used to close the umbilical fascia.    We again inspected the right upper quadrant for hemostasis.  Pneumoperitoneum was released as we removed the trocars.  4-0 Monocryl  was used to close the skin.   Skin glue  was then applied.  The patient was then extubated and brought to the recovery room in stable condition. Instrument, sponge, and needle counts were correct at closure and at the conclusion of the case.   Findings: Chronic cholecystitis with Cholelithiasis  Estimated Blood Loss: Minimal         Drains: none         Specimens: Gallbladder           Complications: None; patient tolerated the procedure well.         Disposition: PACU - hemodynamically stable.         Condition: stable

## 2024-11-30 NOTE — Anesthesia Preprocedure Evaluation (Signed)
 Anesthesia Evaluation  Patient identified by MRN, date of birth, ID band Patient awake    Reviewed: Allergy & Precautions, NPO status , Patient's Chart, lab work & pertinent test results  Airway Mallampati: II  TM Distance: >3 FB Neck ROM: Full    Dental  (+) Dental Advisory Given   Pulmonary Current Smoker and Patient abstained from smoking.   breath sounds clear to auscultation       Cardiovascular negative cardio ROS  Rhythm:Regular Rate:Normal     Neuro/Psych negative neurological ROS     GI/Hepatic negative GI ROS, Neg liver ROS,,,  Endo/Other  negative endocrine ROS    Renal/GU negative Renal ROS     Musculoskeletal   Abdominal   Peds  Hematology negative hematology ROS (+)   Anesthesia Other Findings   Reproductive/Obstetrics                             Anesthesia Physical Anesthesia Plan  ASA: 2  Anesthesia Plan: General   Post-op Pain Management: Tylenol PO (pre-op)* and Toradol IV (intra-op)*   Induction: Intravenous  PONV Risk Score and Plan: 2 and Dexamethasone, Ondansetron, Midazolam and Treatment may vary due to age or medical condition  Airway Management Planned: Oral ETT  Additional Equipment: None  Intra-op Plan:   Post-operative Plan: Extubation in OR  Informed Consent: I have reviewed the patients History and Physical, chart, labs and discussed the procedure including the risks, benefits and alternatives for the proposed anesthesia with the patient or authorized representative who has indicated his/her understanding and acceptance.     Dental advisory given  Plan Discussed with: CRNA  Anesthesia Plan Comments:        Anesthesia Quick Evaluation

## 2024-12-01 ENCOUNTER — Encounter (HOSPITAL_COMMUNITY): Payer: Self-pay | Admitting: Surgery

## 2024-12-01 LAB — SURGICAL PATHOLOGY
